# Patient Record
Sex: Female | Born: 1990 | Race: White | Hispanic: No | Marital: Married | Smoking: Never smoker
Health system: Southern US, Community
[De-identification: ages and names within clinical notes are randomized; demographics above are authoritative.]

## PROBLEM LIST (undated history)

## (undated) DIAGNOSIS — K219 Gastro-esophageal reflux disease without esophagitis: Secondary | ICD-10-CM

## (undated) DIAGNOSIS — F32A Depression, unspecified: Secondary | ICD-10-CM

## (undated) DIAGNOSIS — F419 Anxiety disorder, unspecified: Secondary | ICD-10-CM

## (undated) DIAGNOSIS — G43109 Migraine with aura, not intractable, without status migrainosus: Secondary | ICD-10-CM

## (undated) DIAGNOSIS — N83209 Unspecified ovarian cyst, unspecified side: Secondary | ICD-10-CM

## (undated) DIAGNOSIS — S42309A Unspecified fracture of shaft of humerus, unspecified arm, initial encounter for closed fracture: Secondary | ICD-10-CM

## (undated) HISTORY — DX: Depression, unspecified: F32.A

## (undated) HISTORY — PX: LAPAROSCOPIC OVARIAN CYSTECTOMY: SUR786

## (undated) HISTORY — DX: Anxiety disorder, unspecified: F41.9

## (undated) HISTORY — PX: OTHER SURGICAL HISTORY: SHX169

## (undated) HISTORY — PX: COLON SURGERY: SHX602

## (undated) HISTORY — PX: WISDOM TOOTH EXTRACTION: SHX21

## (undated) HISTORY — DX: Gastro-esophageal reflux disease without esophagitis: K21.9

---
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
## 2015-10-10 DIAGNOSIS — S42309A Unspecified fracture of shaft of humerus, unspecified arm, initial encounter for closed fracture: Secondary | ICD-10-CM

## 2015-10-10 HISTORY — DX: Unspecified fracture of shaft of humerus, unspecified arm, initial encounter for closed fracture: S42.309A

## 2016-04-21 DIAGNOSIS — Z01419 Encounter for gynecological examination (general) (routine) without abnormal findings: Secondary | ICD-10-CM | POA: Diagnosis not present

## 2016-04-21 DIAGNOSIS — Z681 Body mass index (BMI) 19 or less, adult: Secondary | ICD-10-CM | POA: Diagnosis not present

## 2016-04-21 DIAGNOSIS — Z113 Encounter for screening for infections with a predominantly sexual mode of transmission: Secondary | ICD-10-CM | POA: Diagnosis not present

## 2016-05-03 DIAGNOSIS — F419 Anxiety disorder, unspecified: Secondary | ICD-10-CM | POA: Diagnosis not present

## 2016-06-09 DIAGNOSIS — F419 Anxiety disorder, unspecified: Secondary | ICD-10-CM | POA: Diagnosis not present

## 2016-10-09 HISTORY — PX: OTHER SURGICAL HISTORY: SHX169

## 2016-11-02 DIAGNOSIS — M25571 Pain in right ankle and joints of right foot: Secondary | ICD-10-CM | POA: Diagnosis not present

## 2016-11-02 DIAGNOSIS — M25572 Pain in left ankle and joints of left foot: Secondary | ICD-10-CM | POA: Diagnosis not present

## 2017-01-31 DIAGNOSIS — S52131A Displaced fracture of neck of right radius, initial encounter for closed fracture: Secondary | ICD-10-CM | POA: Diagnosis not present

## 2017-01-31 DIAGNOSIS — M25521 Pain in right elbow: Secondary | ICD-10-CM | POA: Diagnosis not present

## 2017-01-31 DIAGNOSIS — M25531 Pain in right wrist: Secondary | ICD-10-CM | POA: Diagnosis not present

## 2017-04-04 DIAGNOSIS — R51 Headache: Secondary | ICD-10-CM | POA: Diagnosis not present

## 2017-04-04 DIAGNOSIS — H539 Unspecified visual disturbance: Secondary | ICD-10-CM | POA: Diagnosis not present

## 2017-04-05 DIAGNOSIS — H538 Other visual disturbances: Secondary | ICD-10-CM | POA: Diagnosis not present

## 2017-04-05 DIAGNOSIS — R51 Headache: Secondary | ICD-10-CM | POA: Diagnosis not present

## 2017-04-05 DIAGNOSIS — R110 Nausea: Secondary | ICD-10-CM | POA: Diagnosis not present

## 2017-04-17 DIAGNOSIS — G43109 Migraine with aura, not intractable, without status migrainosus: Secondary | ICD-10-CM | POA: Diagnosis not present

## 2017-04-17 DIAGNOSIS — R0989 Other specified symptoms and signs involving the circulatory and respiratory systems: Secondary | ICD-10-CM | POA: Diagnosis not present

## 2017-04-25 DIAGNOSIS — R0989 Other specified symptoms and signs involving the circulatory and respiratory systems: Secondary | ICD-10-CM | POA: Diagnosis not present

## 2017-08-13 DIAGNOSIS — Z113 Encounter for screening for infections with a predominantly sexual mode of transmission: Secondary | ICD-10-CM | POA: Diagnosis not present

## 2017-08-13 DIAGNOSIS — Z124 Encounter for screening for malignant neoplasm of cervix: Secondary | ICD-10-CM | POA: Diagnosis not present

## 2017-08-13 DIAGNOSIS — Z1151 Encounter for screening for human papillomavirus (HPV): Secondary | ICD-10-CM | POA: Diagnosis not present

## 2017-08-13 DIAGNOSIS — Z30019 Encounter for initial prescription of contraceptives, unspecified: Secondary | ICD-10-CM | POA: Diagnosis not present

## 2017-08-13 DIAGNOSIS — Z01419 Encounter for gynecological examination (general) (routine) without abnormal findings: Secondary | ICD-10-CM | POA: Diagnosis not present

## 2017-09-11 DIAGNOSIS — S93602A Unspecified sprain of left foot, initial encounter: Secondary | ICD-10-CM | POA: Diagnosis not present

## 2017-09-27 DIAGNOSIS — S93602D Unspecified sprain of left foot, subsequent encounter: Secondary | ICD-10-CM | POA: Diagnosis not present

## 2017-09-27 DIAGNOSIS — M7989 Other specified soft tissue disorders: Secondary | ICD-10-CM | POA: Diagnosis not present

## 2017-09-27 DIAGNOSIS — S99922D Unspecified injury of left foot, subsequent encounter: Secondary | ICD-10-CM | POA: Diagnosis not present

## 2017-09-27 DIAGNOSIS — M79672 Pain in left foot: Secondary | ICD-10-CM | POA: Diagnosis not present

## 2017-10-09 DIAGNOSIS — G43109 Migraine with aura, not intractable, without status migrainosus: Secondary | ICD-10-CM

## 2017-10-09 HISTORY — DX: Migraine with aura, not intractable, without status migrainosus: G43.109

## 2017-10-09 HISTORY — PX: OTHER SURGICAL HISTORY: SHX169

## 2018-01-03 DIAGNOSIS — R42 Dizziness and giddiness: Secondary | ICD-10-CM | POA: Diagnosis not present

## 2018-01-03 DIAGNOSIS — R55 Syncope and collapse: Secondary | ICD-10-CM | POA: Diagnosis not present

## 2018-01-04 DIAGNOSIS — Z01812 Encounter for preprocedural laboratory examination: Secondary | ICD-10-CM | POA: Diagnosis not present

## 2018-01-04 DIAGNOSIS — Z30430 Encounter for insertion of intrauterine contraceptive device: Secondary | ICD-10-CM | POA: Diagnosis not present

## 2018-02-01 DIAGNOSIS — Z30431 Encounter for routine checking of intrauterine contraceptive device: Secondary | ICD-10-CM | POA: Diagnosis not present

## 2018-02-01 DIAGNOSIS — Z975 Presence of (intrauterine) contraceptive device: Secondary | ICD-10-CM | POA: Diagnosis not present

## 2018-03-15 DIAGNOSIS — N898 Other specified noninflammatory disorders of vagina: Secondary | ICD-10-CM | POA: Diagnosis not present

## 2018-06-11 DIAGNOSIS — Z113 Encounter for screening for infections with a predominantly sexual mode of transmission: Secondary | ICD-10-CM | POA: Diagnosis not present

## 2018-06-11 DIAGNOSIS — N9089 Other specified noninflammatory disorders of vulva and perineum: Secondary | ICD-10-CM | POA: Diagnosis not present

## 2018-06-11 DIAGNOSIS — N898 Other specified noninflammatory disorders of vagina: Secondary | ICD-10-CM | POA: Diagnosis not present

## 2018-06-11 DIAGNOSIS — Z975 Presence of (intrauterine) contraceptive device: Secondary | ICD-10-CM | POA: Diagnosis not present

## 2018-07-02 DIAGNOSIS — J069 Acute upper respiratory infection, unspecified: Secondary | ICD-10-CM | POA: Diagnosis not present

## 2018-07-04 ENCOUNTER — Emergency Department (HOSPITAL_COMMUNITY)
Admission: EM | Admit: 2018-07-04 | Discharge: 2018-07-04 | Disposition: A | Discharge status: Home / Self Care | Payer: BLUE CROSS/BLUE SHIELD | Attending: Emergency Medicine | Admitting: Emergency Medicine

## 2018-07-04 ENCOUNTER — Emergency Department (HOSPITAL_COMMUNITY): Payer: BLUE CROSS/BLUE SHIELD

## 2018-07-04 ENCOUNTER — Encounter (HOSPITAL_COMMUNITY): Payer: Self-pay

## 2018-07-04 ENCOUNTER — Other Ambulatory Visit: Payer: Self-pay

## 2018-07-04 DIAGNOSIS — R1032 Left lower quadrant pain: Secondary | ICD-10-CM | POA: Diagnosis not present

## 2018-07-04 DIAGNOSIS — N83202 Unspecified ovarian cyst, left side: Secondary | ICD-10-CM | POA: Insufficient documentation

## 2018-07-04 DIAGNOSIS — R110 Nausea: Secondary | ICD-10-CM | POA: Diagnosis not present

## 2018-07-04 DIAGNOSIS — N83292 Other ovarian cyst, left side: Secondary | ICD-10-CM | POA: Diagnosis not present

## 2018-07-04 DIAGNOSIS — D72829 Elevated white blood cell count, unspecified: Secondary | ICD-10-CM | POA: Insufficient documentation

## 2018-07-04 DIAGNOSIS — K567 Ileus, unspecified: Secondary | ICD-10-CM | POA: Diagnosis not present

## 2018-07-04 DIAGNOSIS — R1084 Generalized abdominal pain: Secondary | ICD-10-CM | POA: Diagnosis not present

## 2018-07-04 LAB — CBC
HCT: 40.8 % (ref 36.0–46.0)
HEMOGLOBIN: 13.9 g/dL (ref 12.0–15.0)
MCH: 33.4 pg (ref 26.0–34.0)
MCHC: 34.1 g/dL (ref 30.0–36.0)
MCV: 98.1 fL (ref 78.0–100.0)
Platelets: 216 10*3/uL (ref 150–400)
RBC: 4.16 MIL/uL (ref 3.87–5.11)
RDW: 12.7 % (ref 11.5–15.5)
WBC: 17.1 10*3/uL — ABNORMAL HIGH (ref 4.0–10.5)

## 2018-07-04 LAB — COMPREHENSIVE METABOLIC PANEL
ALBUMIN: 4.1 g/dL (ref 3.5–5.0)
ALK PHOS: 42 U/L (ref 38–126)
ALT: 12 U/L (ref 0–44)
ANION GAP: 8 (ref 5–15)
AST: 14 U/L — ABNORMAL LOW (ref 15–41)
BUN: 12 mg/dL (ref 6–20)
CALCIUM: 9.1 mg/dL (ref 8.9–10.3)
CO2: 25 mmol/L (ref 22–32)
CREATININE: 0.6 mg/dL (ref 0.44–1.00)
Chloride: 106 mmol/L (ref 98–111)
GFR calc Af Amer: >60 mL/min (ref >60)
GFR calc non Af Amer: >60 mL/min (ref >60)
Glucose, Bld: 109 mg/dL — ABNORMAL HIGH (ref 70–99)
Potassium: 3.9 mmol/L (ref 3.5–5.1)
SODIUM: 139 mmol/L (ref 135–145)
Total Bilirubin: 1.5 mg/dL — ABNORMAL HIGH (ref 0.3–1.2)
Total Protein: 7 g/dL (ref 6.5–8.1)

## 2018-07-04 LAB — URINALYSIS, ROUTINE W REFLEX MICROSCOPIC
Bilirubin Urine: NEGATIVE
Glucose, UA: NEGATIVE mg/dL
Ketones, ur: 5 mg/dL — AB
Nitrite: NEGATIVE
Protein, ur: NEGATIVE mg/dL
SPECIFIC GRAVITY, URINE: 1.021 (ref 1.005–1.030)
pH: 6 (ref 5.0–8.0)

## 2018-07-04 LAB — I-STAT BETA HCG BLOOD, ED (MC, WL, AP ONLY)

## 2018-07-04 LAB — LIPASE, BLOOD: Lipase: 31 U/L (ref 11–51)

## 2018-07-04 MED ORDER — SODIUM CHLORIDE 0.9 % IJ SOLN
INTRAMUSCULAR | Status: AC
  Filled 2018-07-04: qty 50

## 2018-07-04 MED ORDER — ONDANSETRON HCL 4 MG PO TABS: 4.0000 mg | ORAL_TABLET | Freq: Four times a day (QID) | ORAL | 0 refills | Status: DC

## 2018-07-04 MED ORDER — SODIUM CHLORIDE 0.9 % IV BOLUS
1000.0000 mL | Freq: Once | INTRAVENOUS | Status: AC
  Administered 2018-07-04: 1000 mL via INTRAVENOUS

## 2018-07-04 MED ORDER — IOPAMIDOL (ISOVUE-300) INJECTION 61%
INTRAVENOUS | Status: AC
  Filled 2018-07-04: qty 100

## 2018-07-04 MED ORDER — IOPAMIDOL (ISOVUE-300) INJECTION 61%
100.0000 mL | Freq: Once | INTRAVENOUS | Status: AC | PRN
  Administered 2018-07-04: 100 mL via INTRAVENOUS

## 2018-07-04 MED ORDER — ONDANSETRON HCL 4 MG/2ML IJ SOLN
4.0000 mg | Freq: Once | INTRAMUSCULAR | Status: AC
  Administered 2018-07-04: 4 mg via INTRAVENOUS
  Filled 2018-07-04: qty 2

## 2018-07-04 MED ORDER — SODIUM CHLORIDE 0.9 % IV SOLN
INTRAVENOUS | Status: DC
  Administered 2018-07-04: 12:00:00 via INTRAVENOUS

## 2018-07-04 MED ORDER — MORPHINE SULFATE (PF) 4 MG/ML IV SOLN
4.0000 mg | Freq: Once | INTRAVENOUS | Status: AC
  Administered 2018-07-04: 4 mg via INTRAVENOUS
  Filled 2018-07-04: qty 1

## 2018-07-04 NOTE — ED Notes (Addendum)
Patient is resting comfortably. Will do vitals when pt wakes up.

## 2018-07-04 NOTE — Discharge Instructions (Signed)
Use Tylenol as needed for pain and fever.  Make sure you are eating a very bland diet at this time such as chicken noodle soup, broth, Sprite, ginger ale, applesauce, toast.  Return if the pain becomes worse, you develop vomiting that is not controlled or any other concern.  Plan on following up with your doctor at [facility] physicians if symptoms do not resolve.

## 2018-07-04 NOTE — ED Provider Notes (Signed)
[facility]-EMERGENCY DEPT Provider Note   CSN: [id number] Arrival date & time: 07/04/18  1030     History   Chief Complaint Chief Complaint  Patient presents with  . sent from PCP  . possible bowel obstruction    HPI Claire Adams is a 27 y.o. female.  Patient is a 27 year old healthy female presenting today with worsening abdominal pain.  Abdominal pain started around 3:00 yesterday afternoon and she thought she may be constipated.  She took a stool softener but states she had a small bowel movement overnight and then just some yellow liquid.  She has had nausea without vomiting.  The abdominal pain is just worsened.  She was seen at [facility] walk-in clinic today and had labs done.  She was told she was not pregnant but labs were pending.  She had an x-ray done there that was concerning for obstruction.  She has had no prior abdominal surgeries.  She denies fever today, urinary complaints or vaginal complaints.  IUD was placed in May and she has had no problems.  Pain in her abdomen is worse with walking, hitting bumps in the car or any type of movements.  The history is provided by the patient.    History reviewed. No pertinent past medical history.  There are no active problems to display for this patient.   History reviewed. No pertinent surgical history.   OB History   None      Home Medications    Prior to Admission medications   Not on File    Family History Family History  Problem Relation Age of Onset  . Healthy Mother   . Healthy Father     Social History Social History   Tobacco Use  . Smoking status: Never Smoker  . Smokeless tobacco: Never Used  Substance Use Topics  . Alcohol use: Yes  . Drug use: Never     Allergies   Patient has no known allergies.   Review of Systems Review of Systems  All other systems reviewed and are negative.    Physical Exam Updated Vital Signs BP 113/66 (BP Location: Left Arm)    Pulse (!) 101   Temp 99 F (37.2 C) (Oral)   Resp 16   Ht 5' 8.5" (1.74 m)   Wt 61.2 kg   LMP 06/09/2018   SpO2 100%   BMI 20.23 kg/m   Physical Exam  Constitutional: She is oriented to person, place, and time. She appears well-developed and well-nourished. No distress.  HENT:  Head: Normocephalic and atraumatic.  Mouth/Throat: Oropharynx is clear and moist.  Eyes: Pupils are equal, round, and reactive to light. Conjunctivae and EOM are normal.  Neck: Normal range of motion. Neck supple.  Cardiovascular: Normal rate, regular rhythm and intact distal pulses.  No murmur heard. Pulmonary/Chest: Effort normal and breath sounds normal. No respiratory distress. She has no wheezes. She has no rales.  Abdominal: Soft. She exhibits no distension. There is tenderness in the periumbilical area and left lower quadrant. There is guarding. There is no rebound and no CVA tenderness.  Musculoskeletal: Normal range of motion. She exhibits no edema or tenderness.  Neurological: She is alert and oriented to person, place, and time.  Skin: Skin is warm and dry. No rash noted. No erythema.  Psychiatric: She has a normal mood and affect. Her behavior is normal.  Nursing note and vitals reviewed.    ED Treatments / Results  Labs (all labs ordered are listed,  but only abnormal results are displayed) Labs Reviewed  COMPREHENSIVE METABOLIC PANEL - Abnormal; Notable for the following components:      Result Value   Glucose, Bld 109 (*)    AST 14 (*)    Total Bilirubin 1.5 (*)    All other components within normal limits  CBC - Abnormal; Notable for the following components:   WBC 17.1 (*)    All other components within normal limits  URINALYSIS, ROUTINE W REFLEX MICROSCOPIC - Abnormal; Notable for the following components:   Color, Urine STRAW (*)    Hgb urine dipstick SMALL (*)    Ketones, ur 5 (*)    Leukocytes, UA SMALL (*)    Bacteria, UA RARE (*)    All other components within normal  limits  LIPASE, BLOOD  I-STAT BETA HCG BLOOD, ED ([facility], [facility], [facility] ONLY)    EKG None  Radiology US Transvaginal Non-ob  Result Date: 07/04/2018 CLINICAL DATA:  Severe lower abdomen pain starting today. EXAM: TRANSABDOMINAL AND TRANSVAGINAL ULTRASOUND OF PELVIS DOPPLER ULTRASOUND OF OVARIES TECHNIQUE: Both transabdominal and transvaginal ultrasound examinations of the pelvis were performed. Transabdominal technique was performed for global imaging of the pelvis including uterus, ovaries, adnexal regions, and pelvic cul-de-sac. It was necessary to proceed with endovaginal exam following the transabdominal exam to visualize the ovaries. Color and duplex Doppler ultrasound was utilized to evaluate blood flow to the ovaries. COMPARISON:  None. FINDINGS: Uterus Measurements: 7.6 x 3.5 x 5.5 cm no fibroids or other mass visualized. Endometrium Thickness: 7.5 mm. IUD is identified. No focal abnormality visualized. Right ovary Measurements: 4.1 x 2.3 x 2.7 cm. Normal appearance/no adnexal mass. Left ovary Measurements: 5.9 x 3.6 x 4.2 cm. 5 x 3 x 3.4 cm mild complicated cyst is identified in the left ovary. Pulsed Doppler evaluation of both ovaries demonstrates normal low-resistance arterial and venous waveforms. Other findings No abnormal free fluid. IMPRESSION: No evidence of ovarian torsion bilaterally. 5 x 3 x 3.4 cm mild complicated cyst in the left ovary Electronically Signed   By: Sherian Rein M.D.   On: 07/04/2018 15:54   US Pelvis Complete  Result Date: 07/04/2018 CLINICAL DATA:  Severe lower abdomen pain starting today. EXAM: TRANSABDOMINAL AND TRANSVAGINAL ULTRASOUND OF PELVIS DOPPLER ULTRASOUND OF OVARIES TECHNIQUE: Both transabdominal and transvaginal ultrasound examinations of the pelvis were performed. Transabdominal technique was performed for global imaging of the pelvis including uterus, ovaries, adnexal regions, and pelvic cul-de-sac. It was necessary to proceed with endovaginal exam following  the transabdominal exam to visualize the ovaries. Color and duplex Doppler ultrasound was utilized to evaluate blood flow to the ovaries. COMPARISON:  None. FINDINGS: Uterus Measurements: 7.6 x 3.5 x 5.5 cm no fibroids or other mass visualized. Endometrium Thickness: 7.5 mm. IUD is identified. No focal abnormality visualized. Right ovary Measurements: 4.1 x 2.3 x 2.7 cm. Normal appearance/no adnexal mass. Left ovary Measurements: 5.9 x 3.6 x 4.2 cm. 5 x 3 x 3.4 cm mild complicated cyst is identified in the left ovary. Pulsed Doppler evaluation of both ovaries demonstrates normal low-resistance arterial and venous waveforms. Other findings No abnormal free fluid. IMPRESSION: No evidence of ovarian torsion bilaterally. 5 x 3 x 3.4 cm mild complicated cyst in the left ovary Electronically Signed   By: Sherian Rein M.D.   On: 07/04/2018 15:54   Ct Abdomen Pelvis W Contrast  Result Date: 07/04/2018 CLINICAL DATA:  Diffuse abdominal pain and nausea. Small amount of yellow liquid stool 7 times last night. Clinical  concern for bowel obstruction. Leukocytosis. EXAM: CT ABDOMEN AND PELVIS WITH CONTRAST TECHNIQUE: Multidetector CT imaging of the abdomen and pelvis was performed using the standard protocol following bolus administration of intravenous contrast. CONTRAST:  ISOVUE-300 IOPAMIDOL (ISOVUE-300) INJECTION 61% COMPARISON:  None. FINDINGS: Lower chest: Small amount of subpleural scarring or atelectasis at the left lung base. Hepatobiliary: No focal liver abnormality is seen. No gallstones, gallbladder wall thickening, or biliary dilatation. Pancreas: Unremarkable. No pancreatic ductal dilatation or surrounding inflammatory changes. Spleen: Normal in size without focal abnormality. Adrenals/Urinary Tract: Adrenal glands are unremarkable. Kidneys are normal, without renal calculi, focal lesion, or hydronephrosis. Bladder is unremarkable. Stomach/Bowel: Mildly prominent loops of small bowel in the pelvis and  left lower abdomen without abnormal dilatation. The proximal and distal small bowel loops have normal appearances. The right and transverse colon are mildly prominent without abnormal dilatation. Normal appearing stomach. No evidence of appendicitis. Vascular/Lymphatic: No significant vascular findings are present. No enlarged abdominal or pelvic lymph nodes. Reproductive: Intrauterine device in the uterus. Right ovarian corpus luteum. Left adnexal cystic area measuring 4.6 x 3.2 cm on image number 66 series 2. Other: Small amount of free peritoneal fluid in the pelvic cul-de-sac, within normal limits of physiological fluid. Musculoskeletal: Transitional lumbosacral vertebra with a pseudoarticulation with the sacrum on the left. Mild dextroconvex lumbar scoliosis. IMPRESSION: 1. Mild ileus involving portions of the small bowel and colon with no abnormal dilatation suspicious for obstruction at this time. 2. Left adnexal cystic area measuring 4.6 x 3.2 cm. This is most likely a simple left ovarian cyst. This does not have typical features of a tubo-ovarian abscess at this time. This is almost certainly benign, and no specific imaging follow up is recommended according to the Society of Radiologists in Ultrasound 2010 Consensus Conference Statement (D Lenis Noon et al. Management of Asymptomatic Ovarian and Other Adnexal Cysts Imaged at [place]: Society of Radiologists in Ultrasound Consensus Conference Statement 2010. Radiology 256 (Sept 2010): 943-954.). Electronically Signed   By: Beckie Salts M.D.   On: 07/04/2018 13:03   [place] Art/ven Flow Abd Pelv Doppler  Result Date: 07/04/2018 CLINICAL DATA:  Severe lower abdomen pain starting today. EXAM: TRANSABDOMINAL AND TRANSVAGINAL ULTRASOUND OF PELVIS DOPPLER ULTRASOUND OF OVARIES TECHNIQUE: Both transabdominal and transvaginal ultrasound examinations of the pelvis were performed. Transabdominal technique was performed for global imaging of the pelvis including uterus,  ovaries, adnexal regions, and pelvic cul-de-sac. It was necessary to proceed with endovaginal exam following the transabdominal exam to visualize the ovaries. Color and duplex Doppler ultrasound was utilized to evaluate blood flow to the ovaries. COMPARISON:  None. FINDINGS: Uterus Measurements: 7.6 x 3.5 x 5.5 cm no fibroids or other mass visualized. Endometrium Thickness: 7.5 mm. IUD is identified. No focal abnormality visualized. Right ovary Measurements: 4.1 x 2.3 x 2.7 cm. Normal appearance/no adnexal mass. Left ovary Measurements: 5.9 x 3.6 x 4.2 cm. 5 x 3 x 3.4 cm mild complicated cyst is identified in the left ovary. Pulsed Doppler evaluation of both ovaries demonstrates normal low-resistance arterial and venous waveforms. Other findings No abnormal free fluid. IMPRESSION: No evidence of ovarian torsion bilaterally. 5 x 3 x 3.4 cm mild complicated cyst in the left ovary Electronically Signed   By: Sherian Rein M.D.   On: 07/04/2018 15:54    Procedures Procedures (including critical care time)  Medications Ordered in ED Medications  sodium chloride 0.9 % bolus 1,000 mL (1,000 mLs Intravenous New Bag/Given 07/04/18 1140)  0.9 %  sodium chloride infusion (  Intravenous New Bag/Given 07/04/18 1141)  morphine 4 MG/ML injection 4 mg (4 mg Intravenous Given 07/04/18 1135)  ondansetron (ZOFRAN) injection 4 mg (4 mg Intravenous Given 07/04/18 1135)     Initial Impression / Assessment and Plan / ED Course  I have reviewed the triage vital signs and the nursing notes.  Pertinent labs & imaging results that were available during my care of the patient were reviewed by me and considered in my medical decision making (see chart for details).     Young healthy female presenting today with abdominal pain and nausea.  She states the pain is worsened since yesterday.  She denies any urinary or vaginal complaints.  Patient thought she was constipated however she normally has bowel movements every 2 to 3 days  and her last bowel movement other than a small 1 this morning was 2 days ago.  On exam patient has some guarding and significant tenderness in the left lower quadrant.  Concern for possible diverticulitis, perforation, atypical appendicitis, lower suspicion for obstruction, ovarian pathology or possibly kidney stone as well. White count is significant for leukocytosis of 17,000.  CMP, UA, lipase and hCG still pending.  CT of the abdomen and pelvis pending.  Patient given IV fluids and pain control.  2:04 PM Rest of labs and urine within normal limits.  CT is suggestive of mild ileus intermittently involving the small intestine and colon.  Also patient has a 3 x 4 cm left ovarian cyst that does not appear complicated.  On reevaluation patient is improved after morphine.  We will do an ultrasound to ensure no ovarian torsion.  This is most likely viral in etiology.  Findings discussed with the patient and her parents.  4:12 PM [place] neg for torsion and pt will follow up with OB/GYN for repeat US.  Pt still having improved pain.  D/ced home with zofran and to take tylenol  Final Clinical Impressions(s) / ED Diagnoses   Final diagnoses:  Generalized abdominal pain  Left ovarian cyst    ED Discharge Orders         Ordered    ondansetron (ZOFRAN) 4 MG tablet  Every 6 hours     07/04/18 1603           Gwyneth Sprout, MD 07/04/18 [phone]

## 2018-07-04 NOTE — ED Triage Notes (Signed)
Patient was seen by her PCP today and sent to the ED for possible bowel obstruction. Patient reports nausea and had a small amount of stool and had yellow liquid stool x 7 last night. Patient c/o generalized abdominal pain.

## 2018-07-04 NOTE — ED Notes (Signed)
Family at bedside. 

## 2018-07-30 DIAGNOSIS — R933 Abnormal findings on diagnostic imaging of other parts of digestive tract: Secondary | ICD-10-CM | POA: Diagnosis not present

## 2018-07-30 DIAGNOSIS — K5900 Constipation, unspecified: Secondary | ICD-10-CM | POA: Diagnosis not present

## 2018-07-30 DIAGNOSIS — R109 Unspecified abdominal pain: Secondary | ICD-10-CM | POA: Diagnosis not present

## 2018-07-30 DIAGNOSIS — R140 Abdominal distension (gaseous): Secondary | ICD-10-CM | POA: Diagnosis not present

## 2018-07-31 DIAGNOSIS — R102 Pelvic and perineal pain: Secondary | ICD-10-CM | POA: Diagnosis not present

## 2018-07-31 DIAGNOSIS — N83202 Unspecified ovarian cyst, left side: Secondary | ICD-10-CM | POA: Diagnosis not present

## 2018-07-31 DIAGNOSIS — Z30013 Encounter for initial prescription of injectable contraceptive: Secondary | ICD-10-CM | POA: Diagnosis not present

## 2018-07-31 DIAGNOSIS — N939 Abnormal uterine and vaginal bleeding, unspecified: Secondary | ICD-10-CM | POA: Diagnosis not present

## 2018-07-31 DIAGNOSIS — N921 Excessive and frequent menstruation with irregular cycle: Secondary | ICD-10-CM | POA: Diagnosis not present

## 2018-08-05 DIAGNOSIS — N83201 Unspecified ovarian cyst, right side: Secondary | ICD-10-CM | POA: Diagnosis not present

## 2018-08-05 DIAGNOSIS — N83202 Unspecified ovarian cyst, left side: Secondary | ICD-10-CM | POA: Diagnosis not present

## 2018-08-05 DIAGNOSIS — R102 Pelvic and perineal pain: Secondary | ICD-10-CM | POA: Diagnosis not present

## 2018-08-09 DIAGNOSIS — Z30432 Encounter for removal of intrauterine contraceptive device: Secondary | ICD-10-CM | POA: Diagnosis not present

## 2018-08-09 DIAGNOSIS — N838 Other noninflammatory disorders of ovary, fallopian tube and broad ligament: Secondary | ICD-10-CM | POA: Diagnosis not present

## 2018-08-09 DIAGNOSIS — N83202 Unspecified ovarian cyst, left side: Secondary | ICD-10-CM | POA: Diagnosis not present

## 2018-08-09 DIAGNOSIS — N83291 Other ovarian cyst, right side: Secondary | ICD-10-CM | POA: Diagnosis not present

## 2018-08-09 DIAGNOSIS — N736 Female pelvic peritoneal adhesions (postinfective): Secondary | ICD-10-CM | POA: Diagnosis not present

## 2018-08-09 DIAGNOSIS — Z975 Presence of (intrauterine) contraceptive device: Secondary | ICD-10-CM | POA: Diagnosis not present

## 2018-08-09 DIAGNOSIS — N8311 Corpus luteum cyst of right ovary: Secondary | ICD-10-CM | POA: Diagnosis not present

## 2018-08-09 DIAGNOSIS — N83201 Unspecified ovarian cyst, right side: Secondary | ICD-10-CM | POA: Diagnosis not present

## 2018-08-10 ENCOUNTER — Encounter (HOSPITAL_COMMUNITY): Payer: Self-pay | Admitting: *Deleted

## 2018-08-10 ENCOUNTER — Inpatient Hospital Stay (HOSPITAL_COMMUNITY): Payer: BLUE CROSS/BLUE SHIELD

## 2018-08-10 ENCOUNTER — Inpatient Hospital Stay (HOSPITAL_COMMUNITY)
Admission: AD | Admit: 2018-08-10 | Discharge: 2018-08-13 | DRG: 863 | Disposition: A | Discharge status: Home / Self Care | Payer: BLUE CROSS/BLUE SHIELD | Attending: Obstetrics and Gynecology | Admitting: Obstetrics and Gynecology

## 2018-08-10 DIAGNOSIS — T8141XA Infection following a procedure, superficial incisional surgical site, initial encounter: Secondary | ICD-10-CM | POA: Diagnosis not present

## 2018-08-10 DIAGNOSIS — T8149XA Infection following a procedure, other surgical site, initial encounter: Secondary | ICD-10-CM | POA: Diagnosis present

## 2018-08-10 DIAGNOSIS — R109 Unspecified abdominal pain: Secondary | ICD-10-CM | POA: Diagnosis not present

## 2018-08-10 DIAGNOSIS — L0291 Cutaneous abscess, unspecified: Secondary | ICD-10-CM | POA: Diagnosis not present

## 2018-08-10 HISTORY — DX: Unspecified ovarian cyst, unspecified side: N83.209

## 2018-08-10 HISTORY — DX: Unspecified fracture of shaft of humerus, unspecified arm, initial encounter for closed fracture: S42.309A

## 2018-08-10 HISTORY — DX: Migraine with aura, not intractable, without status migrainosus: G43.109

## 2018-08-10 LAB — CBC WITH DIFFERENTIAL/PLATELET
BASOS ABS: 0 10*3/uL (ref 0.0–0.1)
Basophils Relative: 0 %
EOS PCT: 0 %
Eosinophils Absolute: 0 10*3/uL (ref 0.0–0.5)
HCT: 33.6 % — ABNORMAL LOW (ref 36.0–46.0)
HEMOGLOBIN: 11 g/dL — AB (ref 12.0–15.0)
LYMPHS ABS: 3.2 10*3/uL (ref 0.7–4.0)
LYMPHS PCT: 20 %
MCH: 31.5 pg (ref 26.0–34.0)
MCHC: 32.7 g/dL (ref 30.0–36.0)
MCV: 96.3 fL (ref 80.0–100.0)
Monocytes Absolute: 0.6 10*3/uL (ref 0.1–1.0)
Monocytes Relative: 3 %
NEUTROS ABS: 12.7 10*3/uL — AB (ref 1.7–7.7)
NEUTROS PCT: 77 %
Platelets: 508 10*3/uL — ABNORMAL HIGH (ref 150–400)
RBC: 3.49 MIL/uL — AB (ref 3.87–5.11)
RDW: 13.8 % (ref 11.5–15.5)
WBC: 16.5 10*3/uL — AB (ref 4.0–10.5)
nRBC: 0 % (ref 0.0–0.2)

## 2018-08-10 LAB — COMPREHENSIVE METABOLIC PANEL
ALK PHOS: 52 U/L (ref 38–126)
ALT: 11 U/L (ref 0–44)
AST: 11 U/L — AB (ref 15–41)
Albumin: 3.1 g/dL — ABNORMAL LOW (ref 3.5–5.0)
Anion gap: 9 (ref 5–15)
BUN: 6 mg/dL (ref 6–20)
CALCIUM: 8.6 mg/dL — AB (ref 8.9–10.3)
CHLORIDE: 101 mmol/L (ref 98–111)
CO2: 28 mmol/L (ref 22–32)
CREATININE: 0.64 mg/dL (ref 0.44–1.00)
GFR calc Af Amer: >60 mL/min (ref >60)
Glucose, Bld: 106 mg/dL — ABNORMAL HIGH (ref 70–99)
Potassium: 4.1 mmol/L (ref 3.5–5.1)
SODIUM: 138 mmol/L (ref 135–145)
Total Bilirubin: 0.3 mg/dL (ref 0.3–1.2)
Total Protein: 7.3 g/dL (ref 6.5–8.1)

## 2018-08-10 LAB — URINALYSIS, ROUTINE W REFLEX MICROSCOPIC
Bacteria, UA: NONE SEEN
Bilirubin Urine: NEGATIVE
GLUCOSE, UA: NEGATIVE mg/dL
Ketones, ur: NEGATIVE mg/dL
Nitrite: NEGATIVE
PH: 7 (ref 5.0–8.0)
Protein, ur: NEGATIVE mg/dL
SPECIFIC GRAVITY, URINE: 1.011 (ref 1.005–1.030)

## 2018-08-10 LAB — POCT PREGNANCY, URINE: Preg Test, Ur: NEGATIVE

## 2018-08-10 MED ORDER — SODIUM CHLORIDE 0.9% FLUSH: 9.0000 mL | INTRAVENOUS | Status: DC | PRN

## 2018-08-10 MED ORDER — KETOROLAC TROMETHAMINE 60 MG/2ML IM SOLN
60.0000 mg | Freq: Once | INTRAMUSCULAR | Status: AC
  Administered 2018-08-10: 60 mg via INTRAMUSCULAR
  Filled 2018-08-10: qty 2

## 2018-08-10 MED ORDER — HYDROMORPHONE HCL 1 MG/ML IJ SOLN
1.0000 mg | Freq: Once | INTRAMUSCULAR | Status: DC
  Filled 2018-08-10: qty 1

## 2018-08-10 MED ORDER — PIPERACILLIN-TAZOBACTAM 3.375 G IVPB 30 MIN
3.3750 g | Freq: Once | INTRAVENOUS | Status: AC
  Administered 2018-08-10: 3.375 g via INTRAVENOUS
  Filled 2018-08-10: qty 50

## 2018-08-10 MED ORDER — HYDROMORPHONE HCL 1 MG/ML IJ SOLN
1.0000 mg | Freq: Once | INTRAMUSCULAR | Status: AC
  Administered 2018-08-10: 1 mg via INTRAVENOUS

## 2018-08-10 MED ORDER — LACTATED RINGERS IV SOLN
INTRAVENOUS | Status: DC
  Administered 2018-08-10 – 2018-08-11 (×2): via INTRAVENOUS

## 2018-08-10 MED ORDER — NALOXONE HCL 0.4 MG/ML IJ SOLN: 0.4000 mg | INTRAMUSCULAR | Status: DC | PRN

## 2018-08-10 MED ORDER — PIPERACILLIN-TAZOBACTAM 3.375 G IVPB
3.3750 g | Freq: Three times a day (TID) | INTRAVENOUS | Status: DC
  Administered 2018-08-11 – 2018-08-13 (×7): 3.375 g via INTRAVENOUS
  Filled 2018-08-10 (×10): qty 50

## 2018-08-10 MED ORDER — ONDANSETRON HCL 4 MG/2ML IJ SOLN: 4.0000 mg | Freq: Four times a day (QID) | INTRAMUSCULAR | Status: DC | PRN

## 2018-08-10 MED ORDER — DIPHENHYDRAMINE HCL 50 MG/ML IJ SOLN: 12.5000 mg | Freq: Four times a day (QID) | INTRAMUSCULAR | Status: DC | PRN

## 2018-08-10 MED ORDER — PRENATAL MULTIVITAMIN CH
1.0000 | ORAL_TABLET | Freq: Every day | ORAL | Status: DC
  Administered 2018-08-12: 1 via ORAL
  Filled 2018-08-10 (×2): qty 1

## 2018-08-10 MED ORDER — IOPAMIDOL (ISOVUE-300) INJECTION 61%
75.0000 mL | Freq: Once | INTRAVENOUS | Status: AC | PRN
  Administered 2018-08-10: 75 mL via INTRAVENOUS

## 2018-08-10 MED ORDER — DIPHENHYDRAMINE HCL 12.5 MG/5ML PO ELIX
12.5000 mg | ORAL_SOLUTION | Freq: Four times a day (QID) | ORAL | Status: DC | PRN
  Filled 2018-08-10: qty 5

## 2018-08-10 MED ORDER — MORPHINE SULFATE 2 MG/ML IV SOLN
INTRAVENOUS | Status: DC
  Administered 2018-08-10: 23:00:00 via INTRAVENOUS
  Administered 2018-08-11: 2 mg via INTRAVENOUS
  Administered 2018-08-11: 0 mg via INTRAVENOUS
  Filled 2018-08-10: qty 30

## 2018-08-10 NOTE — MAU Note (Signed)
Pt had a bilateral ovarian cystectomy yesterday.(done at surgical center by Dr. Rana Snare) Reports abd pain has gotten worse since yesterday. Taking pain medications without relief (vicodin)

## 2018-08-10 NOTE — MAU Provider Note (Signed)
History     CSN: [id number]  Arrival date and time: 08/10/18 1451   First Provider Initiated Contact with Patient 08/10/18 1605     Chief Complaint  Patient presents with  . Abdominal Pain   HPI Claire Adams is a 27 y.o. G0P0000 postop from a ovarian cyst removal yesterday who presents with severe abdominal pain. She states the pain started this morning and has gradually gotten worse. She rates the pain an 8/10 and has tried vicodin with no relief. She reports the pain as generalized all over her abdomen and causing nausea.   OB History    Gravida  0   Para  0   Term  0   Preterm  0   AB  0   Living  0     SAB  0   TAB  0   Ectopic  0   Multiple  0   Live Births  0           Past Medical History:  Diagnosis Date  . Ovarian cyst     Past Surgical History:  Procedure Laterality Date  . LAPAROSCOPIC OVARIAN CYSTECTOMY Bilateral   . WISDOM TOOTH EXTRACTION      Family History  Problem Relation Age of Onset  . Healthy Mother   . Healthy Father     Social History   Tobacco Use  . Smoking status: Never Smoker  . Smokeless tobacco: Never Used  Substance Use Topics  . Alcohol use: Yes  . Drug use: Never    Allergies: No Known Allergies  Medications Prior to Admission  Medication Sig Dispense Refill Last Dose  . brompheniramine-pseudoephedrine-DM 30-2-10 MG/5ML syrup Take 5 mLs by mouth 4 (four) times daily as needed (for cough).   0 07/03/2018 at Unknown time  . fluticasone (FLONASE) 50 MCG/ACT nasal spray Place 2 sprays into both nostrils daily.  1 07/03/2018 at Unknown time  . Levonorgestrel (KYLEENA) 19.5 MG IUD 19.5 mg by Intrauterine route once.   07/04/2018 at Unknown time  . ondansetron (ZOFRAN) 4 MG tablet Take 1 tablet (4 mg total) by mouth every 6 (six) hours. 12 tablet 0     Review of Systems  Constitutional: Negative.  Negative for fatigue and fever.  HENT: Negative.   Respiratory: Negative.  Negative for shortness of breath.    Cardiovascular: Negative.  Negative for chest pain.  Gastrointestinal: Positive for abdominal pain and nausea. Negative for constipation, diarrhea and vomiting.  Genitourinary: Positive for vaginal bleeding. Negative for dysuria.  Neurological: Negative.  Negative for dizziness and headaches.   Physical Exam   Blood pressure 107/71, pulse 75, temperature 98.5 F (36.9 C), temperature source Oral, resp. rate 18, height 5' 8.5" (1.74 m), weight 55.8 kg, last menstrual period 07/22/2018.  Physical Exam  Nursing note and vitals reviewed. Constitutional: She is oriented to person, place, and time. She appears well-developed and well-nourished. No distress.  HENT:  Head: Normocephalic.  Eyes: Pupils are equal, round, and reactive to light.  Cardiovascular: Normal rate, regular rhythm and normal heart sounds.  Respiratory: Effort normal and breath sounds normal. No respiratory distress.  GI: Soft. Bowel sounds are normal. She exhibits no distension. There is tenderness in the right lower quadrant, epigastric area and left lower quadrant. There is guarding. There is no rebound.  Neurological: She is alert and oriented to person, place, and time.  Skin: Skin is warm and dry.  Psychiatric: She has a normal mood and affect. Her behavior is  normal. Judgment and thought content normal.    MAU Course  Procedures Results for orders placed or performed during the hospital encounter of 08/10/18 (from the past 24 hour(s))  Urinalysis, Routine w reflex microscopic     Status: Abnormal   Collection Time: 08/10/18  3:35 PM  Result Value Ref Range   Color, Urine YELLOW YELLOW   APPearance CLEAR CLEAR   Specific Gravity, Urine 1.011 1.005 - 1.030   pH 7.0 5.0 - 8.0   Glucose, UA NEGATIVE NEGATIVE mg/dL   Hgb urine dipstick MODERATE (A) NEGATIVE   Bilirubin Urine NEGATIVE NEGATIVE   Ketones, ur NEGATIVE NEGATIVE mg/dL   Protein, ur NEGATIVE NEGATIVE mg/dL   Nitrite NEGATIVE NEGATIVE   Leukocytes,  UA TRACE (A) NEGATIVE   RBC / HPF 21-50 0 - 5 RBC/hpf   WBC, UA 6-10 0 - 5 WBC/hpf   Bacteria, UA NONE SEEN NONE SEEN   Squamous Epithelial / LPF 0-5 0 - 5   Mucus PRESENT   Pregnancy, urine POC     Status: None   Collection Time: 08/10/18  4:01 PM  Result Value Ref Range   Preg Test, Ur NEGATIVE NEGATIVE  CBC with Differential/Platelet     Status: Abnormal   Collection Time: 08/10/18  4:47 PM  Result Value Ref Range   WBC 16.5 (H) 4.0 - 10.5 K/uL   RBC 3.49 (L) 3.87 - 5.11 MIL/uL   Hemoglobin 11.0 (L) 12.0 - 15.0 g/dL   HCT 09.8 (L) 11.9 - 14.7 %   MCV 96.3 80.0 - 100.0 fL   MCH 31.5 26.0 - 34.0 pg   MCHC 32.7 30.0 - 36.0 g/dL   RDW 82.9 56.2 - 13.0 %   Platelets 508 (H) 150 - 400 K/uL   nRBC 0.0 0.0 - 0.2 %   Neutrophils Relative % 77 %   Neutro Abs 12.7 (H) 1.7 - 7.7 K/uL   Lymphocytes Relative 20 %   Lymphs Abs 3.2 0.7 - 4.0 K/uL   Monocytes Relative 3 %   Monocytes Absolute 0.6 0.1 - 1.0 K/uL   Eosinophils Relative 0 %   Eosinophils Absolute 0.0 0.0 - 0.5 K/uL   Basophils Relative 0 %   Basophils Absolute 0.0 0.0 - 0.1 K/uL  Comprehensive metabolic panel     Status: Abnormal   Collection Time: 08/10/18  4:47 PM  Result Value Ref Range   Sodium 138 135 - 145 mmol/L   Potassium 4.1 3.5 - 5.1 mmol/L   Chloride 101 98 - 111 mmol/L   CO2 28 22 - 32 mmol/L   Glucose, Bld 106 (H) 70 - 99 mg/dL   BUN 6 6 - 20 mg/dL   Creatinine, Ser 8.65 0.44 - 1.00 mg/dL   Calcium 8.6 (L) 8.9 - 10.3 mg/dL   Total Protein 7.3 6.5 - 8.1 g/dL   Albumin 3.1 (L) 3.5 - 5.0 g/dL   AST 11 (L) 15 - 41 U/L   ALT 11 0 - 44 U/L   Alkaline Phosphatase 52 38 - 126 U/L   Total Bilirubin 0.3 0.3 - 1.2 mg/dL   GFR calc non Af Amer >60 >60 mL/min   GFR calc Af Amer >60 >60 mL/min   Anion gap 9 5 - 15   Ct Abdomen Pelvis W Contrast  Result Date: 08/10/2018 CLINICAL DATA:  27 year old female status post recent bilateral ovarian cystectomy presenting with worsening abdominal pain. EXAM: CT ABDOMEN  AND PELVIS WITH CONTRAST TECHNIQUE: Multidetector CT imaging of the abdomen  and pelvis was performed using the standard protocol following bolus administration of intravenous contrast. CONTRAST:  75mL ISOVUE-300 IOPAMIDOL (ISOVUE-300) INJECTION 61% COMPARISON:  CT of the abdomen pelvis dated 07/04/2018 FINDINGS: Lower chest: Minimal bibasilar dependent atelectatic changes. The visualized lung bases are otherwise clear. There is small scattered intraperitoneal air consistent with provided history of recent surgery. No significant free fluid. Hepatobiliary: No focal liver abnormality is seen. No gallstones, gallbladder wall thickening, or biliary dilatation. Pancreas: Unremarkable. No pancreatic ductal dilatation or surrounding inflammatory changes. Spleen: Normal in size without focal abnormality. Adrenals/Urinary Tract: Adrenal glands are unremarkable. Kidneys are normal, without renal calculi, focal lesion, or hydronephrosis. Bladder is unremarkable. Stomach/Bowel: Mild thickened appearance of the rectosigmoid, likely reactive to inflammatory changes of the pelvis. There is no bowel obstruction. There is moderate stool throughout the colon. The appendix is normal. Vascular/Lymphatic: The abdominal aorta and IVC are unremarkable. The no portal venous gas. There is no adenopathy. Reproductive: The uterus is anteverted and grossly unremarkable as visualized. There is a loculated appearing fluid collection measuring 3.3 x 3.6 cm in the left hemipelvis containing a small pocket of air. A rind of soft tissue surrounding this fluid collection likely represents the left ovarian tissue. This fluid collection appears to be in the same area as the previously seen left ovarian cyst and likely corresponds to the cyst seen previously. There is a focal area of discontinuity in the wall of this fluid collection with extension of the fluid into the right posterior pelvis. This fluid appears to communicate with a 2.5 x 4.0 cm fluid  in the right hemipelvis which also appears loculated and surrounds the right ovary. Overall the amount of pelvic fluid have increased since the prior CT of 07/04/2018. Loculated appearance with enhancement of the capsule suggest a superimposed infectious process or developing abscess. Clinical correlation is recommended. Other: None Musculoskeletal: There are pockets of soft tissue air in the left groin and medial upper left thigh. Stranding of the periumbilical subcutaneous soft tissues likely related to laparoscopic port placement. The osseous structures are intact. IMPRESSION: 1. Loculated fluids within the pelvis as described may represent rupture of the left ovarian cyst with overall hyperemia of the surrounding tissue/capsule and findings concerning for superimposed infection or developing abscess. 2. Small pockets of intraperitoneal air, postoperative. Electronically Signed   By: Elgie Collard M.D.   On: 08/10/2018 21:40   MDM UA, UPT CBC with Diff CMP Toradol IM- patient still rating pain 7/10 CT Abdomen pelvis  Consulted with Dr. Marcelle Overlie regarding CT scan results and presentation- will admit to [facility] for IV antibiotics and pain management  Assessment and Plan   1. Post-operative wound abscess    -Admit to [facility] -Start IV Zosyn per protocol -Morphine PCA -Patient may eat -MD to assume care of patient   Rolm Bookbinder CNM 08/10/2018, 4:05 PM

## 2018-08-11 ENCOUNTER — Encounter (HOSPITAL_COMMUNITY): Payer: Self-pay

## 2018-08-11 ENCOUNTER — Other Ambulatory Visit: Payer: Self-pay

## 2018-08-11 MED ORDER — IBUPROFEN 600 MG PO TABS
600.0000 mg | ORAL_TABLET | Freq: Four times a day (QID) | ORAL | Status: DC | PRN
  Administered 2018-08-11 – 2018-08-13 (×4): 600 mg via ORAL
  Filled 2018-08-11 (×4): qty 1

## 2018-08-11 MED ORDER — OXYCODONE-ACETAMINOPHEN 5-325 MG PO TABS
1.0000 | ORAL_TABLET | ORAL | Status: DC | PRN
  Administered 2018-08-11 – 2018-08-13 (×8): 1 via ORAL
  Filled 2018-08-11 (×8): qty 1

## 2018-08-11 NOTE — Progress Notes (Signed)
Pharmacy Antibiotic Note  Claire Adams is a 27 y.o. female s/p bilateral ovarian cystoscopy admitted on 08/10/2018 with complaints of diffuse abdominal pain. WBCs are elevated. Pharmacy has been consulted for Zosyn dosing.  Plan: Zosyn 3.375gm IV every 8 hours  Height: 5' 8.5" (174 cm) Weight: 123 lb (55.8 kg) IBW/kg (Calculated) : 65.05  Temp (24hrs), Avg:98.8 F (37.1 C), Min:98.3 F (36.8 C), Max:99.7 F (37.6 C)  Recent Labs  Lab 08/10/18 1647  WBC 16.5*  CREATININE 0.64    Estimated Creatinine Clearance: 94.7 mL/min (by C-G formula based on SCr of 0.64 mg/dL).    No Known Allergies  Antimicrobials this admission:  Dose adjustments this admission: N/A  Microbiology results:  Thank you for allowing pharmacy to be a part of this patient's care.  Arelia Sneddon 08/11/2018 2:32 AM

## 2018-08-11 NOTE — Progress Notes (Signed)
20ml of Dilaudid PCA wasted. Lourdes Sledge, RN witness. Carmelina Dane, RN

## 2018-08-11 NOTE — Progress Notes (Signed)
Patient ID: Claire Adams, female   DOB: 07-14-1991, 27 y.o.   MRN: [id number]   POD 2  S// feeling some better, used MS PCA X 4 doses during the night  O//BP (!) 80/49 (BP Location: Left Arm)   Pulse 71   Temp 98.5 F (36.9 C) (Oral)   Resp 17   Ht 5' 8.5" (1.74 m)   Wt 55.8 kg   LMP 07/22/2018   SpO2 92%   BMI 18.43 kg/m   Abd incs C/D, soft + BS  CBC    Component Value Date/Time   WBC 16.5 (H) 08/10/2018 1647   RBC 3.49 (L) 08/10/2018 1647   HGB 11.0 (L) 08/10/2018 1647   HCT 33.6 (L) 08/10/2018 1647   PLT 508 (H) 08/10/2018 1647   MCV 96.3 08/10/2018 1647   MCH 31.5 08/10/2018 1647   MCHC 32.7 08/10/2018 1647   RDW 13.8 08/10/2018 1647   LYMPHSABS 3.2 08/10/2018 1647   MONOABS 0.6 08/10/2018 1647   EOSABS 0.0 08/10/2018 1647   BASOSABS 0.0 08/10/2018 1647   A+P// post op pelvic infxn, pt was sent home on ABX post op, told she had some pus drained from 1 ovarian cyst  Will D/C MS, cont Zosyn, poss D/C in AM

## 2018-08-12 LAB — CBC
HCT: 29.3 % — ABNORMAL LOW (ref 36.0–46.0)
HEMOGLOBIN: 9.6 g/dL — AB (ref 12.0–15.0)
MCH: 31.1 pg (ref 26.0–34.0)
MCHC: 32.8 g/dL (ref 30.0–36.0)
MCV: 94.8 fL (ref 80.0–100.0)
PLATELETS: 502 10*3/uL — AB (ref 150–400)
RBC: 3.09 MIL/uL — AB (ref 3.87–5.11)
RDW: 14 % (ref 11.5–15.5)
WBC: 9.5 10*3/uL (ref 4.0–10.5)

## 2018-08-12 MED ORDER — ENSURE ENLIVE PO LIQD
237.0000 mL | Freq: Two times a day (BID) | ORAL | Status: DC
  Administered 2018-08-12 – 2018-08-13 (×2): 237 mL via ORAL
  Filled 2018-08-12 (×3): qty 237

## 2018-08-12 MED ORDER — BISACODYL 10 MG RE SUPP
10.0000 mg | Freq: Every day | RECTAL | Status: DC | PRN
  Administered 2018-08-12: 10 mg via RECTAL
  Filled 2018-08-12: qty 1

## 2018-08-12 MED ORDER — ONDANSETRON 8 MG PO TBDP
8.0000 mg | ORAL_TABLET | Freq: Three times a day (TID) | ORAL | Status: DC | PRN
  Administered 2018-08-12 – 2018-08-13 (×2): 8 mg via ORAL
  Filled 2018-08-12 (×3): qty 1

## 2018-08-12 MED ORDER — ONDANSETRON HCL 4 MG/2ML IJ SOLN
4.0000 mg | Freq: Four times a day (QID) | INTRAMUSCULAR | Status: DC
  Administered 2018-08-12: 4 mg via INTRAVENOUS
  Filled 2018-08-12: qty 2

## 2018-08-12 MED ORDER — POLYETHYLENE GLYCOL 3350 17 G PO PACK
17.0000 g | PACK | Freq: Every day | ORAL | Status: DC
  Administered 2018-08-12 – 2018-08-13 (×2): 17 g via ORAL
  Filled 2018-08-12 (×2): qty 1

## 2018-08-12 MED ORDER — ONDANSETRON HCL 4 MG/2ML IJ SOLN
4.0000 mg | Freq: Four times a day (QID) | INTRAMUSCULAR | Status: DC | PRN
  Administered 2018-08-12: 4 mg via INTRAVENOUS
  Filled 2018-08-12: qty 2

## 2018-08-12 NOTE — Progress Notes (Signed)
Subjective: Patient reports tolerating PO, + flatus and no problems voiding.   Still with nausea No BM for 3 days Was able to eat some yesterday for the first time in a while Objective: I have reviewed patient's vital signs, intake and output, medications and labs.  General: alert, cooperative, appears stated age and mild distress GI: incision: clean, dry and intact  Mildly distended generally.  BS+  No rebound, guarding   Assessment/Plan: TOA - WBC now normal after 24 hours IV Zosyn Continue abx Will encourage ambulation, ducolax prn Repeat CBC in am Postop - doint well from LOA, Right ov cystecomy, and diagnosis of Left TOA  LOS: 2 days    Turner Daniels 08/12/2018, 9:18 AM

## 2018-08-12 NOTE — Progress Notes (Signed)
Admission nutrition screen triggered for unintentional weight loss > 10 lbs within the last month. . Patients chart reviewed and assessed  for nutritional risk. Pt reports a 15 lb weight loss over the past month due to pain. Appetite is now returning. Ate spicy food at lunch and experienced nausea after.  Recommendations to Pt: Ensure BID Order bland low fat menu items - discussed specific foods with pt Order small freq meals, snacks allowed TID   Elisabeth Cara M.Odis Luster LDN Neonatal Nutrition Support Specialist/RD III Pager [phone]      Phone [phone]

## 2018-08-13 LAB — CBC
HCT: 33.2 % — ABNORMAL LOW (ref 36.0–46.0)
Hemoglobin: 10.6 g/dL — ABNORMAL LOW (ref 12.0–15.0)
MCH: 30.9 pg (ref 26.0–34.0)
MCHC: 31.9 g/dL (ref 30.0–36.0)
MCV: 96.8 fL (ref 80.0–100.0)
PLATELETS: 562 10*3/uL — AB (ref 150–400)
RBC: 3.43 MIL/uL — ABNORMAL LOW (ref 3.87–5.11)
RDW: 14.1 % (ref 11.5–15.5)
WBC: 9.2 10*3/uL (ref 4.0–10.5)
nRBC: 0 % (ref 0.0–0.2)

## 2018-08-13 MED ORDER — IBUPROFEN 600 MG PO TABS: 600.0000 mg | ORAL_TABLET | Freq: Four times a day (QID) | ORAL | 0 refills | Status: DC | PRN

## 2018-08-13 MED ORDER — OXYCODONE-ACETAMINOPHEN 5-325 MG PO TABS: 1.0000 | ORAL_TABLET | ORAL | 0 refills | Status: DC | PRN

## 2018-08-13 NOTE — Discharge Summary (Signed)
Physician Discharge Summary  Patient ID: Claire Adams MRN: [id number] DOB/AGE: 02/19/1991 27 y.o.  Admit date: 08/10/2018 Discharge date: 08/13/2018  Admission Diagnoses:Left TOA, S/P laproscopic Right ov cystectomy, left TOA drainage, pelvic pain  Discharge Diagnoses: Same Active Problems:   Post-operative wound abscess   Discharged Condition: good  Hospital Course: Pt admitted for worsening pain after  laproscopic Right ov cystectomy, left TOA drainage 1 day prior and was on oral abx..  Her CT scan confirmed operative findings and was admitted for IV abx with zosyn.  Her WBC 16.5 on admission dropped to 9.2 and she remained afebrile throughout her hospital course.  Her pain improved and was able to eat, ambulate, had normal bowel function by HD3.  She was d/cd home with outpatient abx with fu in office in 3 days  Consults: None  Significant Diagnostic Studies: labs: WBC 9.2 08/13/18 CT see admission note  Treatments: antibiotics: Zosyn  Discharge Exam: Blood pressure (!) 89/46, pulse (!) 57, temperature 98.9 F (37.2 C), temperature source Oral, resp. rate 18, height 5' 8.5" (1.74 m), weight 55.8 kg, last menstrual period 07/22/2018, SpO2 99 %. General appearance: alert, cooperative, appears stated age and no distress GI: soft, non-tender; bowel sounds normal; no masses,  no organomegaly Incision/Wound: CD&I  Disposition: Discharge disposition: 01-Home or Self Care       Discharge Instructions    Call MD for:  difficulty breathing, headache or visual disturbances   Complete by:  As directed    Call MD for:  persistant nausea and vomiting   Complete by:  As directed    Call MD for:  redness, tenderness, or signs of infection (pain, swelling, redness, odor or green/yellow discharge around incision site)   Complete by:  As directed    Call MD for:  severe uncontrolled pain   Complete by:  As directed    Call MD for:  temperature >100.4   Complete by:  As directed    Diet general   Complete by:  As directed    Driving Restrictions   Complete by:  As directed    No driving for 2 weeks   Increase activity slowly   Complete by:  As directed         Signed: Turner Daniels 08/13/2018, 10:08 AM

## 2018-08-13 NOTE — Plan of Care (Signed)

## 2018-08-28 DIAGNOSIS — R1032 Left lower quadrant pain: Secondary | ICD-10-CM | POA: Diagnosis not present

## 2018-09-25 DIAGNOSIS — F330 Major depressive disorder, recurrent, mild: Secondary | ICD-10-CM | POA: Diagnosis not present

## 2018-10-23 DIAGNOSIS — R82998 Other abnormal findings in urine: Secondary | ICD-10-CM | POA: Diagnosis not present

## 2018-10-23 DIAGNOSIS — R102 Pelvic and perineal pain: Secondary | ICD-10-CM | POA: Diagnosis not present

## 2018-10-23 DIAGNOSIS — Z113 Encounter for screening for infections with a predominantly sexual mode of transmission: Secondary | ICD-10-CM | POA: Diagnosis not present

## 2018-10-23 DIAGNOSIS — Z3202 Encounter for pregnancy test, result negative: Secondary | ICD-10-CM | POA: Diagnosis not present

## 2018-10-23 DIAGNOSIS — N760 Acute vaginitis: Secondary | ICD-10-CM | POA: Diagnosis not present

## 2018-10-23 DIAGNOSIS — Z3042 Encounter for surveillance of injectable contraceptive: Secondary | ICD-10-CM | POA: Diagnosis not present

## 2018-10-24 DIAGNOSIS — R109 Unspecified abdominal pain: Secondary | ICD-10-CM | POA: Diagnosis not present

## 2018-10-24 DIAGNOSIS — R110 Nausea: Secondary | ICD-10-CM | POA: Diagnosis not present

## 2018-10-24 DIAGNOSIS — K5900 Constipation, unspecified: Secondary | ICD-10-CM | POA: Diagnosis not present

## 2018-10-25 DIAGNOSIS — R102 Pelvic and perineal pain: Secondary | ICD-10-CM | POA: Diagnosis not present

## 2018-10-28 ENCOUNTER — Encounter: Payer: Self-pay | Admitting: Internal Medicine

## 2018-10-28 ENCOUNTER — Other Ambulatory Visit: Payer: BLUE CROSS/BLUE SHIELD | Admitting: Internal Medicine

## 2018-10-28 ENCOUNTER — Ambulatory Visit (INDEPENDENT_AMBULATORY_CARE_PROVIDER_SITE_OTHER): Payer: BLUE CROSS/BLUE SHIELD | Admitting: Internal Medicine

## 2018-10-28 VITALS — BP 102/70 | HR 82 | Ht 68.25 in | Wt 123.0 lb

## 2018-10-28 DIAGNOSIS — K5900 Constipation, unspecified: Secondary | ICD-10-CM

## 2018-10-28 DIAGNOSIS — Z23 Encounter for immunization: Secondary | ICD-10-CM

## 2018-10-28 DIAGNOSIS — Z1321 Encounter for screening for nutritional disorder: Secondary | ICD-10-CM

## 2018-10-28 DIAGNOSIS — Z1329 Encounter for screening for other suspected endocrine disorder: Secondary | ICD-10-CM

## 2018-10-28 DIAGNOSIS — Z8742 Personal history of other diseases of the female genital tract: Secondary | ICD-10-CM

## 2018-10-28 DIAGNOSIS — Z8669 Personal history of other diseases of the nervous system and sense organs: Secondary | ICD-10-CM | POA: Diagnosis not present

## 2018-10-28 DIAGNOSIS — F419 Anxiety disorder, unspecified: Secondary | ICD-10-CM | POA: Diagnosis not present

## 2018-10-28 DIAGNOSIS — Z13220 Encounter for screening for lipoid disorders: Secondary | ICD-10-CM

## 2018-10-28 DIAGNOSIS — Z0000 Encounter for general adult medical examination without abnormal findings: Secondary | ICD-10-CM

## 2018-10-28 LAB — POCT URINALYSIS DIPSTICK
Appearance: NEGATIVE
Bilirubin, UA: NEGATIVE
Glucose, UA: NEGATIVE
Ketones, UA: NEGATIVE
Leukocytes, UA: NEGATIVE
Nitrite, UA: NEGATIVE
Odor: NEGATIVE
Protein, UA: NEGATIVE
Spec Grav, UA: 1.01
Urobilinogen, UA: 0.2 U/dL
pH, UA: 6.5

## 2018-10-28 MED ORDER — CLONAZEPAM 0.5 MG PO TABS: ORAL_TABLET | ORAL | 1 refills | Status: DC

## 2018-10-28 NOTE — Progress Notes (Signed)
   Subjective:    Patient ID: Claire Adams, female    DOB: [birth year], 28 y.o.   MRN: [id number]  HPI 28 year old Female for health maintenance exam and evaluation of medical issues.  In September 2019 ,presented to [facility] emergency department with abdominal pain.  CT showed a left adnexal cystic area measuring 4.6 x 3.2 cm. Subsequently had right ovarian cystectomy and left TOA drainage done at a surgical center on November 1.  She presented to [facility] emergency department with severe abdominal pain on November 2 and was admitted for 3 days.  White count was 16,500 on admission and decreased to normal with IV Zosyn. She had  a [place] IUD placed in the Spring 2019 per old records She is recovered well and is doing okay.  She has history of anxiety disorder  Has had flu vaccine for this season.  Apparently was found to have a right carotid bruit when being evaluated for migraine in 2018 by Dr. Amil Amen at [facility] and had negative carotid Dopplers.  At that time she was asked to consider discontinuing birth control.  Was advised to take Excedrin as needed for migraine headaches.    Recently had onset vomiting and diarrhea over the weekend  Recently saw saw Gastroenterologist Dr. Collene Mares who thought she was constipated last week and asked to take Colace.  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  Has a steady boyfriend who is a Agricultural consultant.  Student at [employer].  Single never married.  Usually has 1 alcoholic drink a week.  Does not smoke.  Family history: Father age 72 with history of hypertension otherwise in good health and mother age 43 in good health.  One brother age 24 in good health.  Review of Systems says she has lost  20 pounds in the past year     Objective:   Physical Exam Vital signs reviewed.  Skin warm and dry.  Nodes none.  TMs and pharynx are clear.  Neck is supple without adenopathy or thyromegaly.  Chest is clear to auscultation.  Cardiac exam regular rate and rhythm normal S1 and S2 without murmurs.  Abdomen no hepatosplenomegaly masses or significant tenderness  Pelvic exam deferred to GYN  Extremities without deformity  Neuro exam: No focal deficits on brief exam       Assessment & Plan:  History of recent right ovarian cystectomy and left TOA requiring hospitalization for abdominal pain and leukocytosis treated with IV Zosyn.  Now on Depo-Provera for contraception.  IUD Kyleena removed.  History of anxiety disorder- patient says she becomes stressed easily.  Relates this to some issues with parents not getting along well some time ago and getting anxious with deadlines.  We will try Klonopin 0.5 mg at onset of anxiety attack.  History of migraine headaches  Constipation-seeing Dr. Collene Mares  Plan: Labs reviewed and are within normal limits including TSH, vitamin D, lipid panel, C met, CBC and dipstick UA.  Return in 1 year or as needed.  Tdap vaccine update given.  Time spent evaluating patient included some 25 minutes reviewing old medical records ER visit, admission to [facility] [place] records in addition to [place] records.

## 2018-10-29 LAB — CBC WITH DIFFERENTIAL/PLATELET
ABSOLUTE MONOCYTES: 399 {cells}/uL (ref 200–950)
Basophils Absolute: 8 cells/uL (ref 0–200)
Basophils Relative: 0.2 %
Eosinophils Absolute: 21 cells/uL (ref 15–500)
Eosinophils Relative: 0.5 %
HEMATOCRIT: 44.3 % (ref 35.0–45.0)
Hemoglobin: 15.3 g/dL (ref 11.7–15.5)
LYMPHS ABS: 1844 {cells}/uL (ref 850–3900)
MCH: 31.7 pg (ref 27.0–33.0)
MCHC: 34.5 g/dL (ref 32.0–36.0)
MCV: 91.7 fL (ref 80.0–100.0)
MPV: 9.6 fL (ref 7.5–12.5)
Monocytes Relative: 9.5 %
NEUTROS PCT: 45.9 %
Neutro Abs: 1928 cells/uL (ref 1500–7800)
Platelets: 244 10*3/uL (ref 140–400)
RBC: 4.83 10*6/uL (ref 3.80–5.10)
RDW: 12.6 % (ref 11.0–15.0)
Total Lymphocyte: 43.9 %
WBC: 4.2 10*3/uL (ref 3.8–10.8)

## 2018-10-29 LAB — LIPID PANEL
Cholesterol: 132 mg/dL (ref <200)
HDL: 53 mg/dL (ref >50)
LDL Cholesterol (Calc): 65 mg/dL (calc)
Non-HDL Cholesterol (Calc): 79 mg/dL (calc) (ref <130)
Total CHOL/HDL Ratio: 2.5 (calc) (ref <5.0)
Triglycerides: 60 mg/dL (ref <150)

## 2018-10-29 LAB — COMPLETE METABOLIC PANEL WITH GFR
AG Ratio: 1.8 (calc) (ref 1.0–2.5)
ALBUMIN MSPROF: 4.4 g/dL (ref 3.6–5.1)
ALKALINE PHOSPHATASE (APISO): 45 U/L (ref 33–115)
ALT: 8 U/L (ref 6–29)
AST: 12 U/L (ref 10–30)
BUN: 15 mg/dL (ref 7–25)
CHLORIDE: 106 mmol/L (ref 98–110)
CO2: 23 mmol/L (ref 20–32)
Calcium: 9.3 mg/dL (ref 8.6–10.2)
Creat: 0.61 mg/dL (ref 0.50–1.10)
GFR, Est African American: 146 mL/min/{1.73_m2} (ref >=60)
GFR, Est Non African American: 126 mL/min/{1.73_m2} (ref >=60)
GLUCOSE: 81 mg/dL (ref 65–99)
Globulin: 2.4 g/dL (calc) (ref 1.9–3.7)
Potassium: 3.7 mmol/L (ref 3.5–5.3)
SODIUM: 140 mmol/L (ref 135–146)
TOTAL PROTEIN: 6.8 g/dL (ref 6.1–8.1)
Total Bilirubin: 0.4 mg/dL (ref 0.2–1.2)

## 2018-10-29 LAB — TSH: TSH: 1.31 mIU/L

## 2018-10-29 LAB — VITAMIN D 25 HYDROXY (VIT D DEFICIENCY, FRACTURES): Vit D, 25-Hydroxy: 33 ng/mL (ref 30–100)

## 2018-11-03 DIAGNOSIS — R6889 Other general symptoms and signs: Secondary | ICD-10-CM | POA: Diagnosis not present

## 2018-11-03 DIAGNOSIS — H1032 Unspecified acute conjunctivitis, left eye: Secondary | ICD-10-CM | POA: Diagnosis not present

## 2018-11-03 DIAGNOSIS — F419 Anxiety disorder, unspecified: Secondary | ICD-10-CM | POA: Insufficient documentation

## 2018-11-03 DIAGNOSIS — J029 Acute pharyngitis, unspecified: Secondary | ICD-10-CM | POA: Diagnosis not present

## 2018-11-03 DIAGNOSIS — J039 Acute tonsillitis, unspecified: Secondary | ICD-10-CM | POA: Diagnosis not present

## 2018-11-03 DIAGNOSIS — H6692 Otitis media, unspecified, left ear: Secondary | ICD-10-CM | POA: Diagnosis not present

## 2018-11-03 NOTE — Patient Instructions (Addendum)
It was a pleasure to see you today.  Lab work is entirely within normal limits.  Try Klonopin at onset of anxiety attack.  Tetanus immunization update given.

## 2018-11-21 DIAGNOSIS — J00 Acute nasopharyngitis [common cold]: Secondary | ICD-10-CM | POA: Diagnosis not present

## 2018-12-07 IMAGING — CT CT ABD-PELV W/ CM
1 of 4 series · 14 of 32 positions shown, 18 images · IV contrast (OMNIPAQUE)
Comparison: CT of the abdomen pelvis dated 07/04/2018

CLINICAL DATA: 27-year-old female status post recent bilateral
ovarian cystectomy presenting with worsening abdominal pain.

EXAM:
CT ABDOMEN AND PELVIS WITH CONTRAST
TECHNIQUE: Multidetector CT imaging of the abdomen and pelvis was performed
using the standard protocol following bolus administration of
intravenous contrast.
CONTRAST:  75mL DQSPM2-SRR IOPAMIDOL (DQSPM2-SRR) INJECTION 61%

[Series 4: (person_name) thins · axial · 0.53mm/px · z∈[+770,+1178]mm · 14 of 665 slices shown, 18 images]
[im 54/665  soft-tissue]
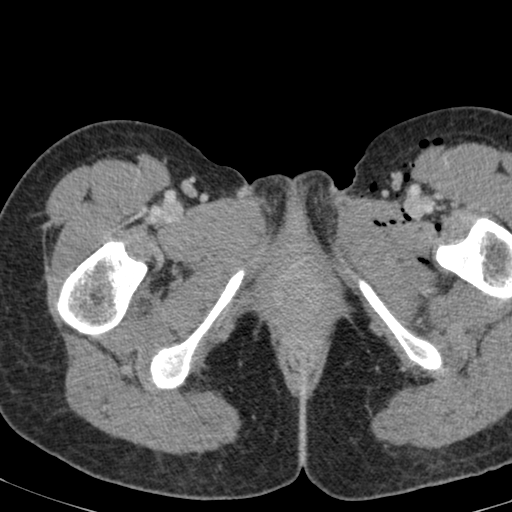
[im 54/665  bone]
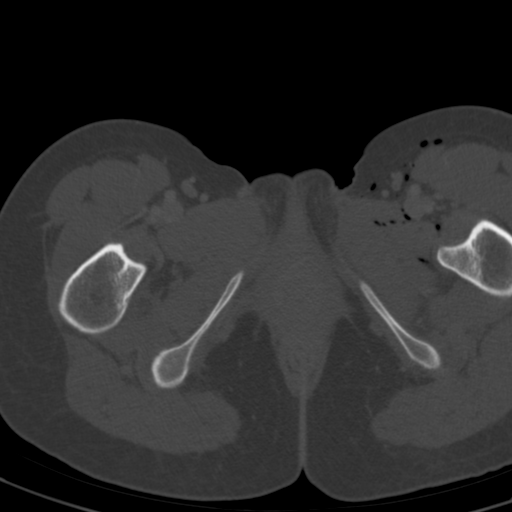
[im 107/665  soft-tissue]
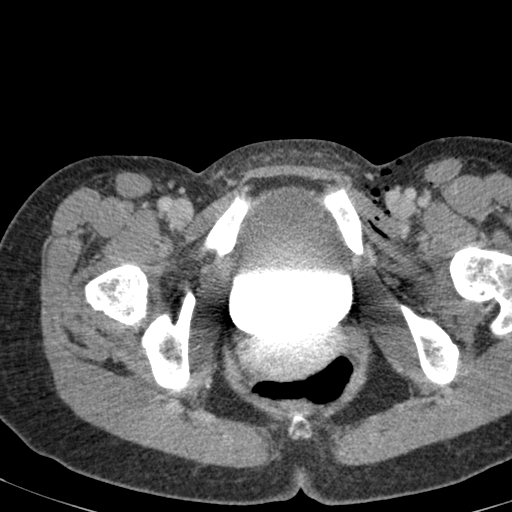
[im 160/665  soft-tissue]
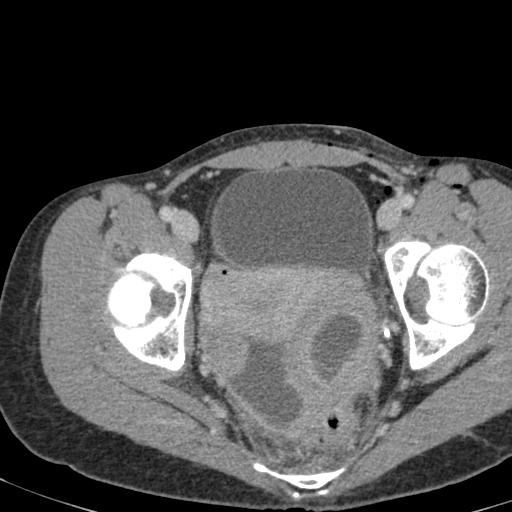
[im 213/665  soft-tissue]
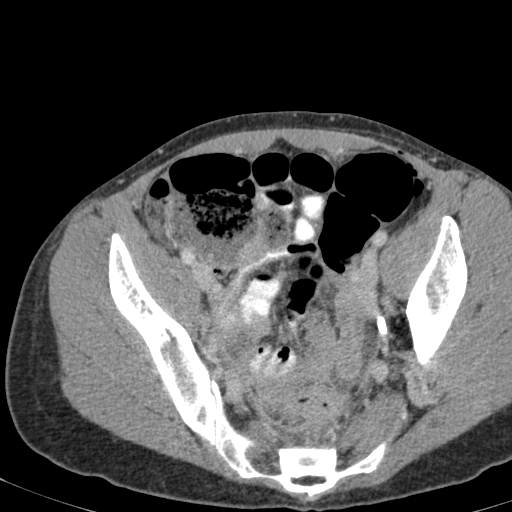
[im 266/665  soft-tissue]
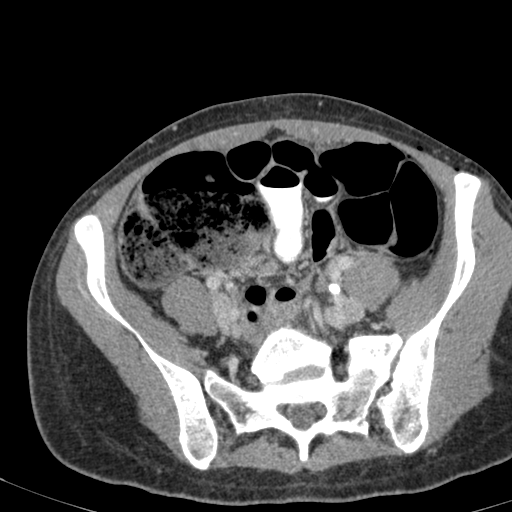
[im 319/665  soft-tissue]
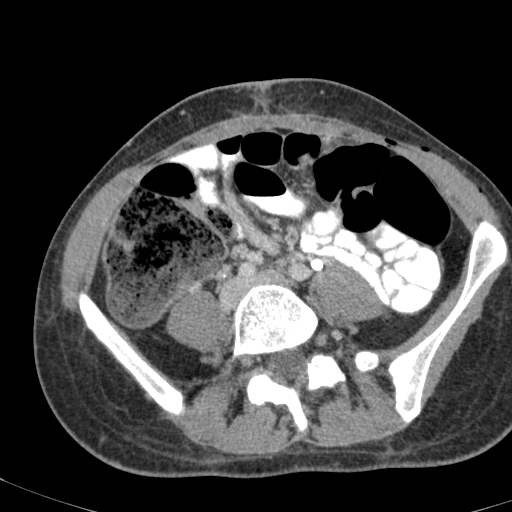
[im 372/665  soft-tissue]
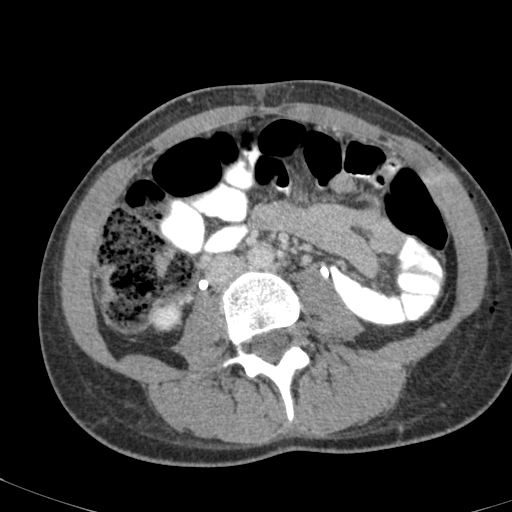
[im 425/665  soft-tissue]
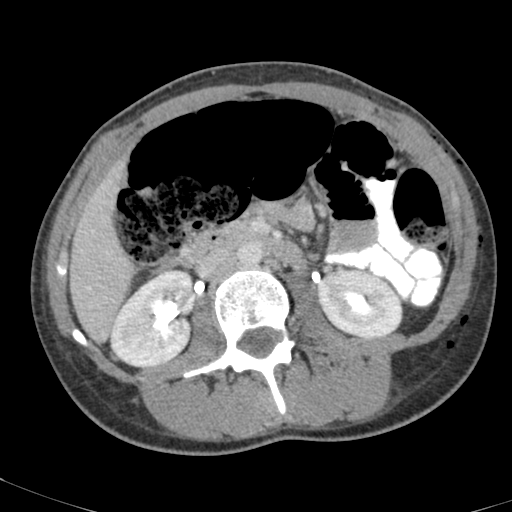
[im 479/665  soft-tissue]
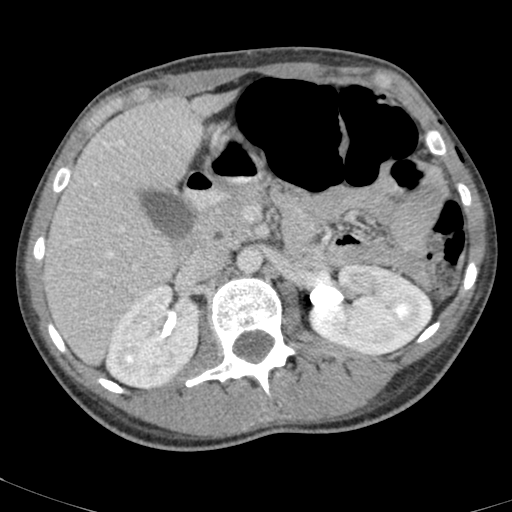
[im 479/665  bone]
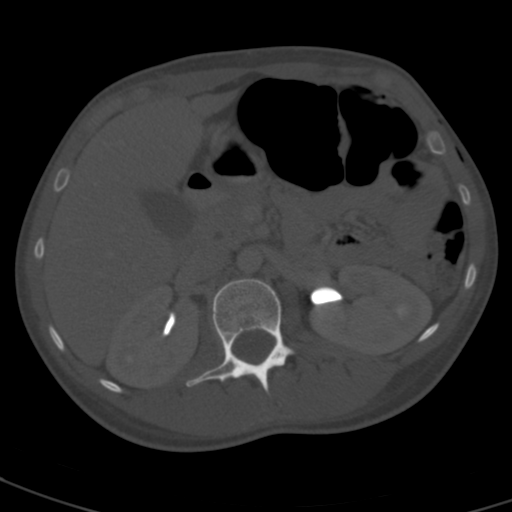
[im 532/665  soft-tissue]
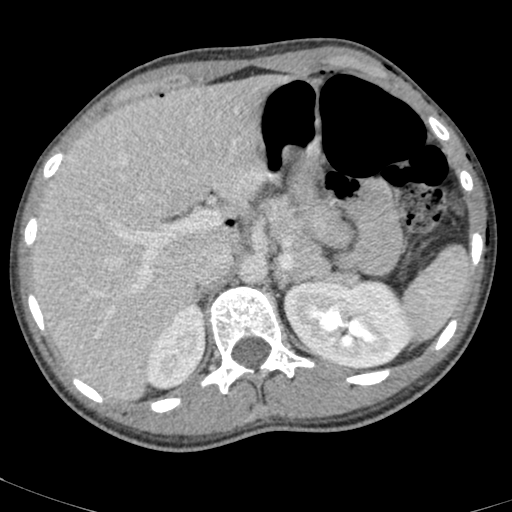
[im 558/665  lung]
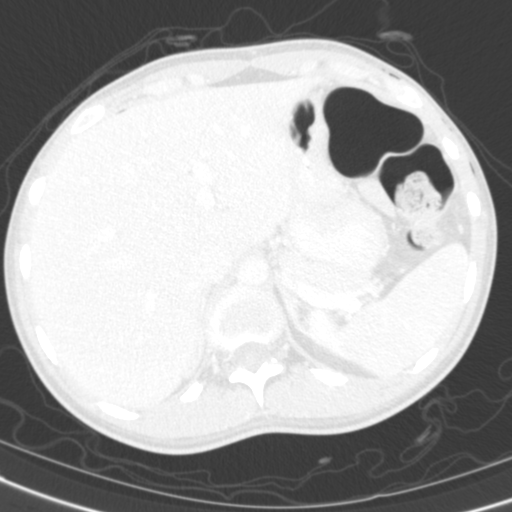
[im 585/665  soft-tissue]
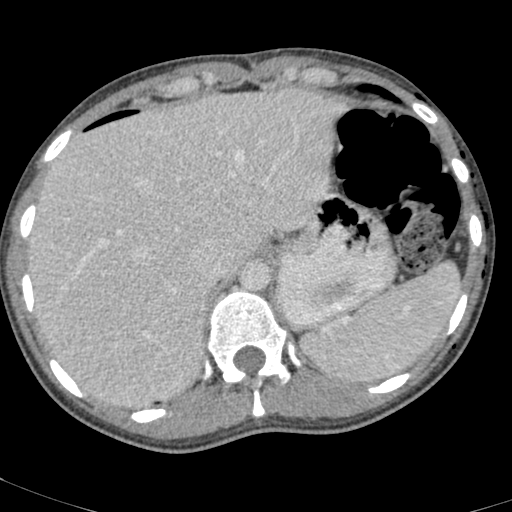
[im 585/665  lung]
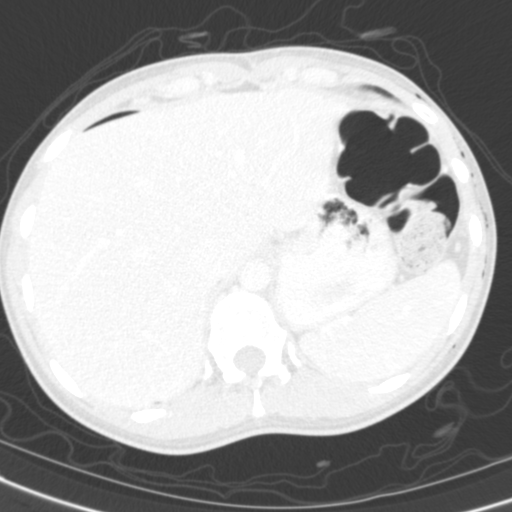
[im 611/665  lung]
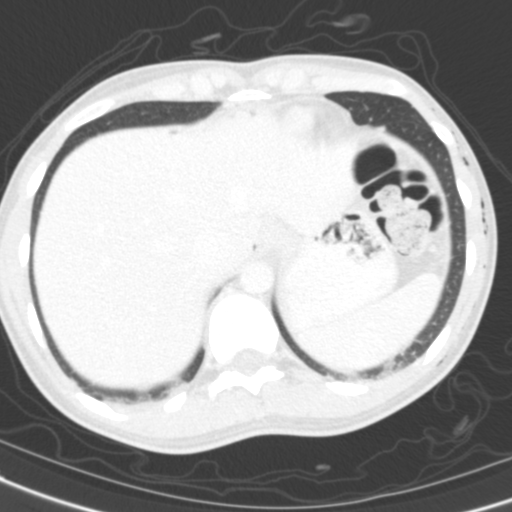
[im 638/665  soft-tissue]
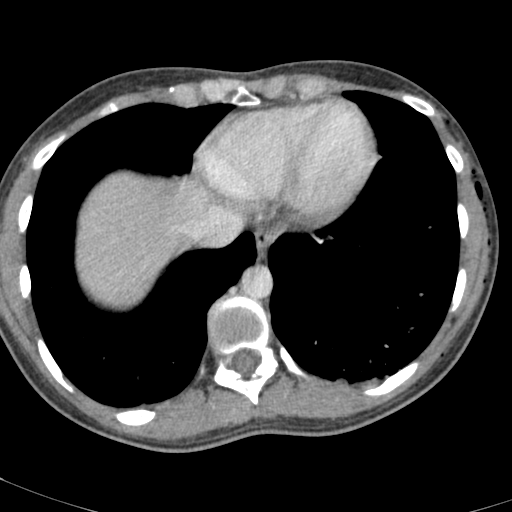
[im 638/665  lung]
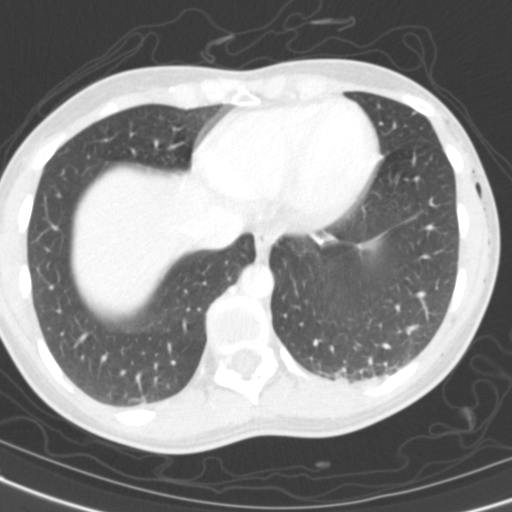

[14 of 32 positions shown; findings below may reference images not displayed]

FINDINGS: Lower chest: Minimal bibasilar dependent atelectatic changes. The
visualized lung bases are otherwise clear.

There is small scattered intraperitoneal air consistent with
provided history of recent surgery. No significant free fluid.

Hepatobiliary: No focal liver abnormality is seen. No gallstones,
gallbladder wall thickening, or biliary dilatation.

Pancreas: Unremarkable. No pancreatic ductal dilatation or
surrounding inflammatory changes.

Spleen: Normal in size without focal abnormality.

Adrenals/Urinary Tract: Adrenal glands are unremarkable. Kidneys are
normal, without renal calculi, focal lesion, or hydronephrosis.
Bladder is unremarkable.

Stomach/Bowel: Mild thickened appearance of the rectosigmoid, likely
reactive to inflammatory changes of the pelvis. There is no bowel
obstruction. There is moderate stool throughout the colon. The
appendix is normal.

Vascular/Lymphatic: The abdominal aorta and IVC are unremarkable.
The no portal venous gas. There is no adenopathy.

Reproductive: The uterus is anteverted and grossly unremarkable as
visualized. There is a loculated appearing fluid collection
measuring 3.3 x 3.6 cm in the left hemipelvis containing a small
pocket of air. A rind of soft tissue surrounding this fluid
collection likely represents the left ovarian tissue. This fluid
collection appears to be in the same area as the previously seen
left ovarian cyst and likely corresponds to the cyst seen
previously. There is a focal area of discontinuity in the wall of
this fluid collection with extension of the fluid into the right
posterior pelvis. This fluid appears to communicate with a 2.5 x
cm fluid in the right hemipelvis which also appears loculated and
surrounds the right ovary. Overall the amount of pelvic fluid have
increased since the prior CT of 07/04/2018. Loculated appearance
with enhancement of the capsule suggest a superimposed infectious
process or developing abscess. Clinical correlation is recommended.

Other: None

Musculoskeletal: There are pockets of soft tissue air in the left
groin and medial upper left thigh. Stranding of the periumbilical
subcutaneous soft tissues likely related to laparoscopic port
placement. The osseous structures are intact.
IMPRESSION: 1. Loculated fluids within the pelvis as described may represent
rupture of the left ovarian cyst with overall hyperemia of the
surrounding tissue/capsule and findings concerning for superimposed
infection or developing abscess.
2. Small pockets of intraperitoneal air, postoperative.

## 2019-01-13 ENCOUNTER — Ambulatory Visit (INDEPENDENT_AMBULATORY_CARE_PROVIDER_SITE_OTHER): Payer: BLUE CROSS/BLUE SHIELD | Admitting: Internal Medicine

## 2019-01-13 ENCOUNTER — Other Ambulatory Visit: Payer: Self-pay

## 2019-01-13 ENCOUNTER — Ambulatory Visit
Admission: RE | Admit: 2019-01-13 | Discharge: 2019-01-13 | Disposition: A | Discharge status: Home / Self Care | Payer: BLUE CROSS/BLUE SHIELD | Source: Ambulatory Visit | Attending: Internal Medicine | Admitting: Internal Medicine

## 2019-01-13 ENCOUNTER — Encounter: Payer: Self-pay | Admitting: Internal Medicine

## 2019-01-13 DIAGNOSIS — K5909 Other constipation: Secondary | ICD-10-CM | POA: Diagnosis not present

## 2019-01-13 DIAGNOSIS — R1013 Epigastric pain: Secondary | ICD-10-CM

## 2019-01-13 DIAGNOSIS — R1010 Upper abdominal pain, unspecified: Secondary | ICD-10-CM

## 2019-01-13 MED ORDER — HYOSCYAMINE SULFATE SL 0.125 MG SL SUBL: SUBLINGUAL_TABLET | SUBLINGUAL | 1 refills | Status: DC

## 2019-01-13 NOTE — Patient Instructions (Addendum)
Levsin 0.125 mg before breakfast and supper.  Have KUB flat and upright abdominal film today.  Addendum: Take Dulcolax tablets p.o. for constipation and then start MiraLAX: 8 ounces of liquid daily.  For pain may take Levsin.

## 2019-01-13 NOTE — Progress Notes (Addendum)
   Subjective:    Patient ID: Claire Adams, female    DOB: 1991-02-24, 28 y.o.   MRN: [id number]  HPI 28 year old Female seen today by WebEx visit for evaluation of complaint of epigastric pain.  She notified me yesterday by patient email that she was having discomfort particularly with meals.  She denies being constipated.  Is having 1 bowel movement daily.  She has a history of anxiety and panic disorder and has been prescribed Klonopin sparingly.  She has not taken that to see if that would help with the pain.  Patient seen by WebEx.  Interactive audio and video telecommunications were available and worked well.  Identified as Claire Adams date of birth [birth year].  She has a history of right ovarian cystectomy and left TOA drainage done by GYN physician on November 1.  She denies fever.  No vomiting.  Appetite is okay.  Due to her prior history of GYN issues, she is concerned.  He had a CT of the abdomen and pelvis with contrast in November and at that point had no gallstones.  There was no inflammation of the pancreas.  Adrenals were unremarkable.  There was no bowel obstruction at the time.  This is when she had what was thought to be a ruptured left ovarian cyst.  Review of Systems no nausea or vomiting.  Denies obstipation or abdominal bloating.  Pain is associated with meals.     Objective:   Physical Exam Patient indicates she is afebrile.  She is seen by WebEx appointment today.  She appears to be in no acute distress at this point in time.  Presses on her upper abdomen at my request and does not appear to have any acute tenderness is witnessed on WebEx visit.  She appears to be in no acute distress.  She is able to give a clear concise history.       Assessment & Plan:  Epigastric pain-it is unlikely she has developed gallstones in the short period of time.  I think it is more likely that she has constipation/obstipation or irritable bowel symptoms.  I am going to prescribe  Levsin 0.125 mg before breakfast and supper to see if this will help her pain.  She will have KUB flat and upright to see if she is constipated or has evidence of free air under the diaphragm.  She reports she is afebrile.  Addendum: KUB shows stool burden.  Recommend Dulcolax 2 tablets p.o. followed by starting MiraLAX tomorrow.  1 capful in 8 ounces of liquid daily.  Take Levsin sparingly for pain

## 2019-01-15 DIAGNOSIS — Z3042 Encounter for surveillance of injectable contraceptive: Secondary | ICD-10-CM | POA: Diagnosis not present

## 2019-02-04 ENCOUNTER — Other Ambulatory Visit: Payer: Self-pay | Admitting: Internal Medicine

## 2019-02-24 ENCOUNTER — Ambulatory Visit (INDEPENDENT_AMBULATORY_CARE_PROVIDER_SITE_OTHER): Payer: BLUE CROSS/BLUE SHIELD | Admitting: Internal Medicine

## 2019-02-24 ENCOUNTER — Encounter: Payer: Self-pay | Admitting: Internal Medicine

## 2019-02-24 DIAGNOSIS — K5904 Chronic idiopathic constipation: Secondary | ICD-10-CM | POA: Diagnosis not present

## 2019-02-24 DIAGNOSIS — F419 Anxiety disorder, unspecified: Secondary | ICD-10-CM

## 2019-02-24 MED ORDER — LUBIPROSTONE 8 MCG PO CAPS: 8.0000 ug | ORAL_CAPSULE | Freq: Two times a day (BID) | ORAL | 0 refills | Status: DC

## 2019-02-24 NOTE — Progress Notes (Signed)
   Subjective:    Patient ID: Claire Adams, female    DOB: 1991-01-07, 28 y.o.   MRN: [id number]  HPI 28 year old Female with longstanding history of obstipation and constipation seen today by interactive audio and video telecommunications due to the coronavirus pandemic.  She is agreeable to visit in this format today.  She is identified using 2 identifiers as Claire, Adams patient in this practice.  Patient took some Dulcolax recently about a week ago and had good results from that with lots of stool being produced.  She has been taking MiraLAX daily for about a week but stopped because the directions indicated not to take it for more than a week.  She failed understand that she could take this indefinitely.  Now feels bloated again.  Is asking about something for bloating.  However if we give her an anticholinergic for bloating then she will likely have more issues with constipation.  But we really need is for her to have regular bowel movements.  Dulcolax caused a lot of cramping for her which is not unusual.  She is concerned because she had surgery for right ovarian cystectomy and left TOA drainage in November.  I do not think this issue is related to that.  She is able to have some bowel movements just not complete bowel movements.  She has a history of anxiety.    In April we prescribe Levsin for abdominal cramping and epigastric pain.  She had a KUB flat and upright film April 6 that showed mild to moderate increased stool throughout the colon.  Planning to go out of town for a week beginning May 21.  Her bloating improved when she took Dulcolax 2 tablets p.o. but then bloating returned.  She is having some lower abdominal pain.  Stools are not solid and occur about every other day but does not feel that she defecates completely.  She has tried probiotics, exercise, drinking coffee and lots of water without much success.    Review of Systems no vomiting no fever no chills     Objective:   Physical Exam  Unable to examine but by history she is having abdominal bloating and incomplete defecation.  Is afebrile by history      Assessment & Plan:  Functional constipation  History of anxiety  Plan: Dulcolax is causing considerable cramping. She was previously told to take MiraLAX but only took it for about a week.  I am suggesting that she take Amitiza 8 mcg twice daily but she will need to get cleaned out with Dulcolax once more before starting the amities a.  She should see some relief within 5 days from the Amitiza.  If symptoms are not improving, we will refer her to Gastroenterologist  25 minutes spent with patient including reviewing previous records, history taking, medical decision making, medication prescribing and prior authorization of Amitiza

## 2019-02-24 NOTE — Patient Instructions (Signed)
We will try to get Amitiza prior authorized for you.  Take Dulcolax 2 tablets then start amities a 8 mcg twice daily.  May discontinue MiraLAX.  May continue with probiotics.

## 2019-03-20 ENCOUNTER — Other Ambulatory Visit: Payer: Self-pay | Admitting: Internal Medicine

## 2019-03-20 NOTE — Telephone Encounter (Signed)
Left message to me back.

## 2019-03-20 NOTE — Telephone Encounter (Signed)
Please call her and see if it is working and does she want it refilled

## 2019-03-31 DIAGNOSIS — J309 Allergic rhinitis, unspecified: Secondary | ICD-10-CM | POA: Diagnosis not present

## 2019-04-01 ENCOUNTER — Ambulatory Visit (INDEPENDENT_AMBULATORY_CARE_PROVIDER_SITE_OTHER): Payer: BC Managed Care – PPO | Admitting: Internal Medicine

## 2019-04-01 ENCOUNTER — Telehealth: Payer: Self-pay | Admitting: Internal Medicine

## 2019-04-01 DIAGNOSIS — K5904 Chronic idiopathic constipation: Secondary | ICD-10-CM

## 2019-04-01 DIAGNOSIS — Z8669 Personal history of other diseases of the nervous system and sense organs: Secondary | ICD-10-CM

## 2019-04-01 DIAGNOSIS — F419 Anxiety disorder, unspecified: Secondary | ICD-10-CM

## 2019-04-01 NOTE — Progress Notes (Signed)
   Subjective:    Patient ID: JAANA BRODT, female    DOB: 06/24/1991, 28 y.o.   MRN: [id number]  HPI 28 year old Female with history of anxiety, migraine headaches, functional constipation seen today by interactive audio and video telecommunications due to the coronavirus pandemic.  She is identified as Sallee Lange, using 2 identifiers and is a patient in this practice.  She is agreeable to visit in this format today.  She has a history of functional constipation which is longstanding.  She messaged me recently saying that [place] worked great but was terribly expensive.  We do have some coupons we can provide for her.  However she thinks she would like to try to manage this on her own.  We talked about MiraLAX but she only took it for about a week previously.  She does have a history of migraine headaches and was evaluated weighted by a neurologist in [place].  I reminded her about this today.  There is no relationship to her migraines that I know of with functional constipation or the medication Amitiza.  Has been on Depo-Provera for birth control.  Reminded her about foods that could cause migraine headaches.    Review of Systems see above     Objective:   Physical Exam  Seen virtually and not examined      Assessment & Plan:  Functional constipation if she cannot afford Amitiza and then she should go back to MiraLAX on a regular basis.  A coupon will be mailed to her.  If she would like to see a gastroenterologist, we can arrange that  History of migraine headaches, likely would respond to triptan medication but she would like to manage this more conservatively with headache diary and watching her food intake.  Return PRN.  Her physical exam is due January 2021.

## 2019-04-01 NOTE — Telephone Encounter (Signed)
Faxed office notes to Dr [facility] office notes.  Mailed Amitiza Savings Card

## 2019-04-06 ENCOUNTER — Encounter: Payer: Self-pay | Admitting: Internal Medicine

## 2019-04-06 DIAGNOSIS — K5904 Chronic idiopathic constipation: Secondary | ICD-10-CM | POA: Insufficient documentation

## 2019-04-06 DIAGNOSIS — Z8669 Personal history of other diseases of the nervous system and sense organs: Secondary | ICD-10-CM | POA: Insufficient documentation

## 2019-04-06 NOTE — Patient Instructions (Signed)
If you cannot afford Amitiza, take MiraLAX.  Keep headache diary.  Try to keep track of events or foods that might cause migraines.  We can refer you to gastroenterologist if constipation continues to be an issue.  We can prescribe medication that will help you with migraines if you would like.  Physical exam due January 2021

## 2019-04-13 ENCOUNTER — Other Ambulatory Visit: Payer: Self-pay

## 2019-04-13 ENCOUNTER — Emergency Department (HOSPITAL_COMMUNITY)
Admission: EM | Admit: 2019-04-13 | Discharge: 2019-04-14 | Disposition: A | Discharge status: Home / Self Care | Payer: BC Managed Care – PPO | Attending: Emergency Medicine | Admitting: Emergency Medicine

## 2019-04-13 ENCOUNTER — Encounter (HOSPITAL_COMMUNITY): Payer: Self-pay | Admitting: Emergency Medicine

## 2019-04-13 DIAGNOSIS — R112 Nausea with vomiting, unspecified: Secondary | ICD-10-CM | POA: Diagnosis not present

## 2019-04-13 DIAGNOSIS — Z79899 Other long term (current) drug therapy: Secondary | ICD-10-CM | POA: Insufficient documentation

## 2019-04-13 DIAGNOSIS — R1013 Epigastric pain: Secondary | ICD-10-CM | POA: Diagnosis not present

## 2019-04-13 DIAGNOSIS — R197 Diarrhea, unspecified: Secondary | ICD-10-CM | POA: Insufficient documentation

## 2019-04-13 LAB — COMPREHENSIVE METABOLIC PANEL
ALT: 20 U/L (ref 0–44)
AST: 18 U/L (ref 15–41)
Albumin: 4.9 g/dL (ref 3.5–5.0)
Alkaline Phosphatase: 56 U/L (ref 38–126)
Anion gap: 10 (ref 5–15)
BUN: 13 mg/dL (ref 6–20)
CO2: 25 mmol/L (ref 22–32)
Calcium: 9.5 mg/dL (ref 8.9–10.3)
Chloride: 104 mmol/L (ref 98–111)
Creatinine, Ser: 0.53 mg/dL (ref 0.44–1.00)
GFR calc Af Amer: >60 mL/min (ref >60)
GFR calc non Af Amer: >60 mL/min (ref >60)
Glucose, Bld: 109 mg/dL — ABNORMAL HIGH (ref 70–99)
Potassium: 3.8 mmol/L (ref 3.5–5.1)
Sodium: 139 mmol/L (ref 135–145)
Total Bilirubin: 0.5 mg/dL (ref 0.3–1.2)
Total Protein: 7.6 g/dL (ref 6.5–8.1)

## 2019-04-13 LAB — URINALYSIS, ROUTINE W REFLEX MICROSCOPIC
Bilirubin Urine: NEGATIVE
Glucose, UA: NEGATIVE mg/dL
Ketones, ur: NEGATIVE mg/dL
Nitrite: NEGATIVE
Protein, ur: 100 mg/dL — AB
RBC / HPF: >50 RBC/hpf — ABNORMAL HIGH (ref 0–5)
Specific Gravity, Urine: 1.011 (ref 1.005–1.030)
WBC, UA: >50 WBC/hpf — ABNORMAL HIGH (ref 0–5)
pH: 5 (ref 5.0–8.0)

## 2019-04-13 LAB — CBC
HCT: 44.9 % (ref 36.0–46.0)
Hemoglobin: 14.8 g/dL (ref 12.0–15.0)
MCH: 32.6 pg (ref 26.0–34.0)
MCHC: 33 g/dL (ref 30.0–36.0)
MCV: 98.9 fL (ref 80.0–100.0)
Platelets: 240 10*3/uL (ref 150–400)
RBC: 4.54 MIL/uL (ref 3.87–5.11)
RDW: 12 % (ref 11.5–15.5)
WBC: 10 10*3/uL (ref 4.0–10.5)
nRBC: 0 % (ref 0.0–0.2)

## 2019-04-13 LAB — LIPASE, BLOOD: Lipase: 29 U/L (ref 11–51)

## 2019-04-13 LAB — I-STAT BETA HCG BLOOD, ED (MC, WL, AP ONLY): I-stat hCG, quantitative: <5 m[IU]/mL (ref <5)

## 2019-04-13 MED ORDER — SODIUM CHLORIDE 0.9% FLUSH: 3.0000 mL | Freq: Once | INTRAVENOUS | Status: DC

## 2019-04-13 NOTE — ED Notes (Signed)
Bed: WTR8 Expected date:  Expected time:  Means of arrival:  Comments: 

## 2019-04-13 NOTE — ED Triage Notes (Signed)
Patient c/o abdominal pain with nausea and diarrhea x1 hour. Reports pain began as generalized abdominal pain, then localized to RUQ.

## 2019-04-14 DIAGNOSIS — B349 Viral infection, unspecified: Secondary | ICD-10-CM | POA: Diagnosis not present

## 2019-04-14 NOTE — Discharge Instructions (Addendum)
Take omeprazole (Prilosec OTC) once a day.  Take antacids as needed.  Take loperamide (Imodium AD) as needed for diarrhea.  Return if pain is getting worse.

## 2019-04-14 NOTE — ED Provider Notes (Signed)
[facility]-EMERGENCY DEPT Provider Note   CSN: [id number] Arrival date & time: 04/13/19  2005    History   Chief Complaint Chief Complaint  Patient presents with  . Abdominal Pain  . Diarrhea    HPI Claire Adams is a 28 y.o. female.   The history is provided by the patient.  She has history of migraines and functional constipation and comes in because of abdominal pain.  She had onset about 6 PM of epigastric pain without radiation.  Pain was severe.  There is associated nausea, vomiting, diarrhea.  She no longer is having any nausea and is no longer feeling like she is going to have more diarrhea.  She did not take any medication for her symptoms.  She denies tobacco and ethanol use.  Past Medical History:  Diagnosis Date  . Broken arm 2017   Right elbow per pt  . 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 Ovarian cyst     Patient Active Problem List   Diagnosis Date Noted  . History of migraine headaches 04/06/2019  . 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 Anxiety 11/03/2018    Past Surgical History:  Procedure Laterality Date  . bilateral ovarian cystectomy Bilateral 2019  . broke left arm Left 2006  . broke right arm  2018  . LAPAROSCOPIC OVARIAN CYSTECTOMY Bilateral   . severe ligament tear right foot    . WISDOM TOOTH EXTRACTION       OB History    Gravida  0   Para  0   Term  0   Preterm  0   AB  0   Living  0     SAB  0   TAB  0   Ectopic  0   Multiple  0   Live Births  0            Home Medications    Prior to Admission medications   Medication Sig Start Date End Date Taking? Authorizing Provider  medroxyPROGESTERone (DEPO-PROVERA) 150 MG/ML injection Inject 150 mg into the muscle every 3 (three) months.   Yes [provider]    Family History Family History  Problem Relation Age of Onset  . Healthy Mother   . Arthritis Mother   . Healthy Father   .  Hypertension Father   . Arthritis Maternal Grandmother   . Stroke Paternal Grandmother     Social History Social History   Tobacco Use  . Smoking status: Never Smoker  . Smokeless tobacco: Never Used  Substance Use Topics  . Alcohol use: Yes  . Drug use: Never     Allergies   Patient has no known allergies.   Review of Systems Review of Systems  All other systems reviewed and are negative.    Physical Exam Updated Vital Signs BP 117/85 (BP Location: Left Arm)   Pulse 89   Temp 98.4 F (36.9 C) (Oral)   Resp 18   Ht 5\' 8"  (1.727 m)   Wt 60.3 kg   SpO2 99%   BMI 20.22 kg/m   Physical Exam Vitals signs and nursing note reviewed.    28 year old female, appears uncomfortable, but is in no acute distress. Vital signs are normal. Oxygen saturation is 99%, which is normal. Head is normocephalic and atraumatic. PERRLA, EOMI. Oropharynx is clear. Neck is nontender and supple without adenopathy or JVD. Back is nontender and there is no CVA tenderness.  Lungs are clear without rales, wheezes, or rhonchi. Chest is nontender. Heart has regular rate and rhythm without murmur. Abdomen is soft, flat, with mild epigastric tenderness.  There is no rebound or guarding.  There are no masses or hepatosplenomegaly and peristalsis is normoactive. Extremities have no cyanosis or edema, full range of motion is present. Skin is warm and dry without rash. Neurologic: Mental status is normal, cranial nerves are intact, there are no motor or sensory deficits.  ED Treatments / Results  Labs (all labs ordered are listed, but only abnormal results are displayed) Labs Reviewed  COMPREHENSIVE METABOLIC PANEL - Abnormal; Notable for the following components:      Result Value   Glucose, Bld 109 (*)    All other components within normal limits  URINALYSIS, ROUTINE W REFLEX MICROSCOPIC - Abnormal; Notable for the following components:   APPearance HAZY (*)    Hgb urine dipstick LARGE (*)     Protein, ur 100 (*)    Leukocytes,Ua MODERATE (*)    RBC / HPF >50 (*)    WBC, UA >50 (*)    Bacteria, UA RARE (*)    All other components within normal limits  LIPASE, BLOOD  CBC  I-STAT BETA HCG BLOOD, ED ([facility], [facility], [facility] ONLY)    Procedures Procedures   Medications Ordered in ED Medications  sodium chloride flush (NS) 0.9 % injection 3 mL (has no administration in time range)     Initial Impression / Assessment and Plan / ED Course  I have reviewed the triage vital signs and the nursing notes.  Pertinent lab results that were available during my care of the patient were reviewed by me and considered in my medical decision making (see chart for details).  Epigastric pain with vomiting and diarrhea, etiology unclear.  Nausea and diarrhea have resolved.  Labs are reassuring.  I have offered to give the patient some medication, but she states that she is anxious to leave.  She is declining nausea medication.  I suspect that her abdominal pain is related to GERD or ulcers.  Possible viral gastroenteritis, possible food poisoning.  No findings to suggest biliary tract disease.  With history of functional diarrhea, consider irritable bowel syndrome.  She is advised to use over-the-counter omeprazole and to use over-the-counter loperamide as needed for diarrhea.  Return precautions discussed.  Old records are reviewed, and she does have prior ED visits for abdominal pain.  Final Clinical Impressions(s) / ED Diagnoses   Final diagnoses:  Epigastric pain  Nausea vomiting and diarrhea    ED Discharge Orders    None       Delora Fuel, MD 44/81/85 [id number]

## 2019-04-15 DIAGNOSIS — R140 Abdominal distension (gaseous): Secondary | ICD-10-CM | POA: Diagnosis not present

## 2019-04-15 DIAGNOSIS — R109 Unspecified abdominal pain: Secondary | ICD-10-CM | POA: Diagnosis not present

## 2019-04-15 DIAGNOSIS — K5904 Chronic idiopathic constipation: Secondary | ICD-10-CM | POA: Diagnosis not present

## 2019-04-18 ENCOUNTER — Other Ambulatory Visit: Payer: Self-pay | Admitting: Gastroenterology

## 2019-04-18 ENCOUNTER — Other Ambulatory Visit: Payer: Self-pay

## 2019-04-18 ENCOUNTER — Ambulatory Visit
Admission: RE | Admit: 2019-04-18 | Discharge: 2019-04-18 | Disposition: A | Discharge status: Home / Self Care | Payer: BC Managed Care – PPO | Source: Ambulatory Visit | Attending: Gastroenterology | Admitting: Gastroenterology

## 2019-04-18 DIAGNOSIS — R1033 Periumbilical pain: Secondary | ICD-10-CM

## 2019-04-18 DIAGNOSIS — R1013 Epigastric pain: Secondary | ICD-10-CM | POA: Diagnosis not present

## 2019-04-18 MED ORDER — IOPAMIDOL (ISOVUE-300) INJECTION 61%
100.0000 mL | Freq: Once | INTRAVENOUS | Status: AC | PRN
  Administered 2019-04-18: 100 mL via INTRAVENOUS

## 2019-06-20 ENCOUNTER — Telehealth: Payer: Self-pay | Admitting: Internal Medicine

## 2019-06-20 NOTE — Telephone Encounter (Signed)
Put referral in for Dr Earlean Shawl and called patient to let her know.

## 2019-06-20 NOTE — Telephone Encounter (Signed)
Please make referral to Dr. Earlean Shawl

## 2019-06-20 NOTE — Telephone Encounter (Signed)
Claire Adams [phone]  Andie called to say she is still having GI issues, Bloating, severe abdominal pain, diarrhea / Constipation. She would like a virtual visit and a referral to Dr Angela Nevin, because she feels like Dr Collene Mares just blew her off.

## 2019-07-16 DIAGNOSIS — R140 Abdominal distension (gaseous): Secondary | ICD-10-CM | POA: Diagnosis not present

## 2019-07-16 DIAGNOSIS — Z791 Long term (current) use of non-steroidal anti-inflammatories (NSAID): Secondary | ICD-10-CM | POA: Diagnosis not present

## 2019-07-16 DIAGNOSIS — R109 Unspecified abdominal pain: Secondary | ICD-10-CM | POA: Diagnosis not present

## 2019-07-16 DIAGNOSIS — R112 Nausea with vomiting, unspecified: Secondary | ICD-10-CM | POA: Diagnosis not present

## 2019-07-16 DIAGNOSIS — R12 Heartburn: Secondary | ICD-10-CM | POA: Diagnosis not present

## 2019-07-24 DIAGNOSIS — N83201 Unspecified ovarian cyst, right side: Secondary | ICD-10-CM | POA: Diagnosis not present

## 2019-07-24 DIAGNOSIS — R102 Pelvic and perineal pain: Secondary | ICD-10-CM | POA: Diagnosis not present

## 2019-08-01 ENCOUNTER — Ambulatory Visit: Payer: BC Managed Care – PPO | Admitting: Internal Medicine

## 2019-08-01 ENCOUNTER — Encounter: Payer: Self-pay | Admitting: Internal Medicine

## 2019-08-01 ENCOUNTER — Telehealth: Payer: Self-pay | Admitting: Internal Medicine

## 2019-08-01 ENCOUNTER — Other Ambulatory Visit: Payer: Self-pay

## 2019-08-01 VITALS — BP 120/80 | HR 83 | Temp 98.0°F | Ht 68.25 in | Wt 123.0 lb

## 2019-08-01 DIAGNOSIS — F411 Generalized anxiety disorder: Secondary | ICD-10-CM | POA: Diagnosis not present

## 2019-08-01 DIAGNOSIS — F410 Panic disorder [episodic paroxysmal anxiety] without agoraphobia: Secondary | ICD-10-CM | POA: Diagnosis not present

## 2019-08-01 MED ORDER — CLONAZEPAM 0.5 MG PO TABS: ORAL_TABLET | ORAL | 0 refills | Status: DC

## 2019-08-01 MED ORDER — SERTRALINE HCL 50 MG PO TABS: 50.0000 mg | ORAL_TABLET | Freq: Every day | ORAL | 3 refills | Status: DC

## 2019-08-01 NOTE — Telephone Encounter (Signed)
Claire Adams [phone]   Andie called to say she would like to see you, she is having some anxiety, with school and in general. Virtual meeting would be fine or in office which ever you prefer.

## 2019-08-01 NOTE — Telephone Encounter (Signed)
Office visit

## 2019-08-01 NOTE — Telephone Encounter (Signed)
Scheduled ov 

## 2019-08-01 NOTE — Telephone Encounter (Signed)
LVM to CB and schedule office visit 

## 2019-08-09 ENCOUNTER — Encounter: Payer: Self-pay | Admitting: Internal Medicine

## 2019-08-09 NOTE — Progress Notes (Signed)
   Subjective:    Patient ID: Claire Adams, female    DOB: 1991/07/16, 28 y.o.   MRN: [id number]  HPI 28 year old Female with longstanding history of anxiety issues.  Is now doing Scientist, product/process development.  Has issues with anxiety not so much with her performance with teaching but other issues.  Denies being worried about COVID-19 pandemic and exposure at school.  She is engaged to be married.  She seems happy.  Ellene Route accompanies her today.  Issues with anxiety go back to childhood.  She does not want to go to counseling right now.  She would like to take some medication and see if symptoms improve.  Issues with panic attacks/panic disorder  Review of Systems history of chronic constipation     Objective:   Physical Exam Blood pressure 120/80 pulse 83 temperature 98 degrees BMI 18.51 weight 123 pounds She seems slightly anxious but her affect thought process and judgment appear to be normal.      Assessment & Plan:  Anxiety disorder/panic disorder  Plan: She will start Klonopin 0.5 mg at onset of panic disorder not to exceed 2 tablets daily.  She will start one half of a 50 mg tablet daily for 5 days and increasing to 50 mg daily.  Follow-up here in 4 weeks.

## 2019-08-09 NOTE — Patient Instructions (Signed)
Klonopin 0.5 mg up to twice daily if needed for panic attack.  Zoloft one half of a 50 milligrams tablet daily for 5 days increasing to 50 mg daily.  Follow-up here in 4 weeks.

## 2019-08-15 DIAGNOSIS — K219 Gastro-esophageal reflux disease without esophagitis: Secondary | ICD-10-CM | POA: Diagnosis not present

## 2019-08-24 ENCOUNTER — Other Ambulatory Visit: Payer: Self-pay | Admitting: Internal Medicine

## 2019-09-09 ENCOUNTER — Encounter: Payer: Self-pay | Admitting: Internal Medicine

## 2019-09-09 ENCOUNTER — Ambulatory Visit (INDEPENDENT_AMBULATORY_CARE_PROVIDER_SITE_OTHER): Payer: BC Managed Care – PPO | Admitting: Internal Medicine

## 2019-09-09 ENCOUNTER — Other Ambulatory Visit: Payer: Self-pay

## 2019-09-09 VITALS — BP 110/70 | HR 67 | Temp 98.6°F | Ht 68.25 in | Wt 139.0 lb

## 2019-09-09 DIAGNOSIS — K5904 Chronic idiopathic constipation: Secondary | ICD-10-CM | POA: Diagnosis not present

## 2019-09-09 DIAGNOSIS — F419 Anxiety disorder, unspecified: Secondary | ICD-10-CM

## 2019-09-09 MED ORDER — SERTRALINE HCL 100 MG PO TABS: 100.0000 mg | ORAL_TABLET | Freq: Every day | ORAL | 1 refills | Status: DC

## 2019-09-09 NOTE — Patient Instructions (Addendum)
Increase Zoloft 100 mg daily and use Klonopin sparingly as needed.  Return in 6 months or as needed. Last CPE Oct 28, 2018.

## 2019-09-09 NOTE — Progress Notes (Signed)
   Subjective:    Patient ID: Claire Adams, female    DOB: 1991/09/12, 28 y.o.   MRN: [id number]  HPI 28 year old Female seen today for follow-up of anxiety disorder. At last visit, was started on Zoloft 50 mg daily and prn Klonopin 0.05 mg up to  twice daily prn.  Says mother has similar issues.  Explained to her it is likely this was an inherited condition.  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 Accompanied by her fiance'. Feeling much better on Zoloft 50 mg daily. Only a couple of episodes of anxiety/panic.    Review of Systems see above-history of constipation- see extensive work up per Dr. Earlean Shawl. Symptoms have improved and she is off . Celiac panel negative and was given PPI for GERD     Objective:   Physical Exam Vital signs reviewed.  She looks great.  Showing no symptoms of anxiety today whatsoever.  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       Assessment & Plan:  Anxiety markedly improved on Zoloft 50 mg daily and as needed Klonopin.Has not really had to use much Klonopin at all.  History of constipation- improved and not requiring medication. Dr. Earlean Shawl thought pelvic adhesions from surgery could be the cause. He felt no treatment needed as symptoms had improved.  Plan:  Increase Zoloft to 100 mg daily to see if that will further improve anxiety.  Return as needed.  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 CPE last done Jan 2020. May return in 6 months or prn.

## 2019-12-13 ENCOUNTER — Ambulatory Visit: Payer: Self-pay | Attending: Internal Medicine

## 2019-12-13 DIAGNOSIS — Z23 Encounter for immunization: Secondary | ICD-10-CM | POA: Insufficient documentation

## 2019-12-13 NOTE — Progress Notes (Signed)
   Covid-19 Vaccination Clinic  Name:  Claire Adams    MRN: [id number] DOB: 03-02-1991  12/13/2019  [name] was observed post Covid-19 immunization for 15 minutes without incident. She was provided with Vaccine Information Sheet and instruction to access the V-Safe system.   [name] was instructed to call 911 with any severe reactions post vaccine: Marland Kitchen Difficulty breathing  . Swelling of face and throat  . A fast heartbeat  . A bad rash all over body  . Dizziness and weakness   Immunizations Administered    Name Date Dose VIS Date Route   Pfizer COVID-19 Vaccine 12/13/2019  8:19 AM 0.3 mL 09/19/2019 Intramuscular   Manufacturer: [employer], [employer]   Lot: [id number]   NDC: [address]-6

## 2020-01-03 ENCOUNTER — Ambulatory Visit: Payer: Self-pay | Attending: Internal Medicine

## 2020-01-03 DIAGNOSIS — Z23 Encounter for immunization: Secondary | ICD-10-CM

## 2020-01-03 NOTE — Progress Notes (Signed)
   Covid-19 Vaccination Clinic  Name:  DERIKA ECKLES    MRN: [id number] DOB: 10-Jul-1991  01/03/2020  [name] was observed post Covid-19 immunization for 15 minutes without incident. She was provided with Vaccine Information Sheet and instruction to access the V-Safe system.   [name] was instructed to call 911 with any severe reactions post vaccine: Marland Kitchen Difficulty breathing  . Swelling of face and throat  . A fast heartbeat  . A bad rash all over body  . Dizziness and weakness   Immunizations Administered    Name Date Dose VIS Date Route   Pfizer COVID-19 Vaccine 01/03/2020  8:23 AM 0.3 mL 09/19/2019 Intramuscular   Manufacturer: [employer], [employer]   Lot: [id number]   NDC: [address]-0

## 2020-01-26 ENCOUNTER — Telehealth: Payer: Self-pay

## 2020-01-26 NOTE — Telephone Encounter (Signed)
Patient has been having stomach pain all day yesterday and this morning.  Pain is in the Upper middle of the stomach.   She is thinking that she has a stomach ulcer?   Would like you opinion on what she should do.  Please review and advise.  Thank you

## 2020-01-26 NOTE — Telephone Encounter (Signed)
Pt was notified of instructions, pt verbalized understanding.   

## 2020-01-26 NOTE — Telephone Encounter (Signed)
Please have her call Dr. Kinnie Scales her gastroenterologist

## 2020-02-03 DIAGNOSIS — R1013 Epigastric pain: Secondary | ICD-10-CM | POA: Diagnosis not present

## 2020-02-04 DIAGNOSIS — R1013 Epigastric pain: Secondary | ICD-10-CM | POA: Diagnosis not present

## 2020-02-16 DIAGNOSIS — R1013 Epigastric pain: Secondary | ICD-10-CM | POA: Diagnosis not present

## 2020-02-16 DIAGNOSIS — K219 Gastro-esophageal reflux disease without esophagitis: Secondary | ICD-10-CM | POA: Diagnosis not present

## 2020-02-20 ENCOUNTER — Telehealth: Payer: Self-pay | Admitting: Internal Medicine

## 2020-02-20 NOTE — Telephone Encounter (Signed)
Wednesday at 10 am

## 2020-02-20 NOTE — Telephone Encounter (Signed)
Pt wants to schedule an appt to discuss anxiety medication for next week, when would you want to see her?

## 2020-02-20 NOTE — Telephone Encounter (Signed)
Scheduled

## 2020-02-25 ENCOUNTER — Encounter: Payer: Self-pay | Admitting: Internal Medicine

## 2020-02-25 ENCOUNTER — Other Ambulatory Visit: Payer: Self-pay

## 2020-02-25 ENCOUNTER — Ambulatory Visit (INDEPENDENT_AMBULATORY_CARE_PROVIDER_SITE_OTHER): Payer: BC Managed Care – PPO | Admitting: Internal Medicine

## 2020-02-25 VITALS — BP 100/80 | HR 80 | Ht 68.25 in | Wt 138.0 lb

## 2020-02-25 DIAGNOSIS — Z8719 Personal history of other diseases of the digestive system: Secondary | ICD-10-CM | POA: Diagnosis not present

## 2020-02-25 DIAGNOSIS — F411 Generalized anxiety disorder: Secondary | ICD-10-CM | POA: Diagnosis not present

## 2020-02-25 DIAGNOSIS — F439 Reaction to severe stress, unspecified: Secondary | ICD-10-CM | POA: Diagnosis not present

## 2020-02-25 DIAGNOSIS — Z8659 Personal history of other mental and behavioral disorders: Secondary | ICD-10-CM | POA: Diagnosis not present

## 2020-02-25 MED ORDER — CLONAZEPAM 0.5 MG PO TABS: 0.5000 mg | ORAL_TABLET | Freq: Three times a day (TID) | ORAL | 1 refills | Status: DC | PRN

## 2020-02-25 NOTE — Patient Instructions (Signed)
Take Klonopin twice daily and may take third dose middle of the day if needed.  Continue Zoloft 100 mg daily.  Please contact the numbers I gave you for psychological counseling and medication consultation.

## 2020-02-28 NOTE — Progress Notes (Signed)
   Subjective:    Patient ID: Claire Adams, female    DOB: 12/16/1990, 29 y.o.   MRN: [id number]  HPI 29 year old Female who is being married in 10 days.  She has a history of anxiety.  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  Has been treated with antianxiety medication consisting of Zoloft 100 mg daily and Klonopin.  As with many weddings, there has been some stress.  She had asked 1 bridesmaid not to be in the wedding.  She feels that she and her fianc have not been communicating as well recently.  There has been some issues with whether cast should wear a masks or not.  She is concerned about food at the reception.  Patient does not smoke.  Usually has 1 alcoholic drink a week.  Graduated [employer].  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  Review of Systems see above-history of constipation.  Has had 2 COVID-19 vaccines.     Objective:   Physical Exam Blood pressure 100/80 pulse 80, pulse oximetry 97% weight 138 pounds  I spent 30 minutes speaking with her about anxiety, treatment options, need for counseling as anxiety continues to resurface from time to time despite being on medication.  Explained I am not comfortable increasing Zoloft more than 100 mg daily.  She may need psychiatric med consult.  Have suggested [facility] for med consultation and counseling.  In the meantime, I am increasing Klonopin to 0.5 mg at least twice and up to 3 times daily.  I think temporarily this will help with situational stress with upcoming wedding and issues surrounding that.  Explained to her that it was not unusual to have anxiety around the time of her wedding with all the preparations needed, wish for perfection, and pressure that brides often feel.      Assessment & Plan:  Anxiety state due to upcoming wedding   History of anxiety disorder  History of constipation-may worsen with increased dose of Klonopin  Plan: Zoloft 100 mg daily.  Klonopin 0.5 mg  at least twice daily and up to 3 times daily as needed for anxiety.  Recommend med consult with [facility] and counseling through them as well.  30 minutes spent with patient

## 2020-03-01 ENCOUNTER — Other Ambulatory Visit: Payer: Self-pay | Admitting: Internal Medicine

## 2020-03-25 ENCOUNTER — Other Ambulatory Visit: Payer: Self-pay

## 2020-06-19 ENCOUNTER — Other Ambulatory Visit: Payer: Self-pay

## 2020-06-19 ENCOUNTER — Encounter (HOSPITAL_COMMUNITY): Payer: Self-pay | Admitting: Emergency Medicine

## 2020-06-19 ENCOUNTER — Inpatient Hospital Stay (HOSPITAL_COMMUNITY)
Admission: AD | Admit: 2020-06-19 | Discharge: 2020-06-25 | DRG: 885 | Disposition: A | Discharge status: Home / Self Care | Payer: BC Managed Care – PPO | Source: Intra-hospital | Attending: Psychiatry | Admitting: Psychiatry

## 2020-06-19 ENCOUNTER — Emergency Department (HOSPITAL_COMMUNITY)
Admission: EM | Admit: 2020-06-19 | Discharge: 2020-06-19 | Disposition: A | Discharge status: Home / Self Care | Payer: BC Managed Care – PPO | Source: Home / Self Care | Attending: Emergency Medicine | Admitting: Emergency Medicine

## 2020-06-19 DIAGNOSIS — F331 Major depressive disorder, recurrent, moderate: Secondary | ICD-10-CM | POA: Diagnosis not present

## 2020-06-19 DIAGNOSIS — F332 Major depressive disorder, recurrent severe without psychotic features: Principal | ICD-10-CM | POA: Diagnosis present

## 2020-06-19 DIAGNOSIS — R9431 Abnormal electrocardiogram [ECG] [EKG]: Secondary | ICD-10-CM | POA: Diagnosis not present

## 2020-06-19 DIAGNOSIS — Z8249 Family history of ischemic heart disease and other diseases of the circulatory system: Secondary | ICD-10-CM

## 2020-06-19 DIAGNOSIS — Y92239 Unspecified place in hospital as the place of occurrence of the external cause: Secondary | ICD-10-CM | POA: Diagnosis present

## 2020-06-19 DIAGNOSIS — Z79899 Other long term (current) drug therapy: Secondary | ICD-10-CM

## 2020-06-19 DIAGNOSIS — Y929 Unspecified place or not applicable: Secondary | ICD-10-CM | POA: Insufficient documentation

## 2020-06-19 DIAGNOSIS — X58XXXA Exposure to other specified factors, initial encounter: Secondary | ICD-10-CM | POA: Insufficient documentation

## 2020-06-19 DIAGNOSIS — Z823 Family history of stroke: Secondary | ICD-10-CM | POA: Diagnosis not present

## 2020-06-19 DIAGNOSIS — K5900 Constipation, unspecified: Secondary | ICD-10-CM | POA: Diagnosis present

## 2020-06-19 DIAGNOSIS — Z811 Family history of alcohol abuse and dependence: Secondary | ICD-10-CM | POA: Diagnosis not present

## 2020-06-19 DIAGNOSIS — Z8261 Family history of arthritis: Secondary | ICD-10-CM | POA: Diagnosis not present

## 2020-06-19 DIAGNOSIS — Z20822 Contact with and (suspected) exposure to covid-19: Secondary | ICD-10-CM | POA: Diagnosis not present

## 2020-06-19 DIAGNOSIS — F411 Generalized anxiety disorder: Secondary | ICD-10-CM | POA: Diagnosis not present

## 2020-06-19 DIAGNOSIS — Y999 Unspecified external cause status: Secondary | ICD-10-CM | POA: Insufficient documentation

## 2020-06-19 DIAGNOSIS — G4700 Insomnia, unspecified: Secondary | ICD-10-CM | POA: Diagnosis not present

## 2020-06-19 DIAGNOSIS — F419 Anxiety disorder, unspecified: Secondary | ICD-10-CM

## 2020-06-19 DIAGNOSIS — Y939 Activity, unspecified: Secondary | ICD-10-CM | POA: Insufficient documentation

## 2020-06-19 DIAGNOSIS — T424X2A Poisoning by benzodiazepines, intentional self-harm, initial encounter: Secondary | ICD-10-CM | POA: Diagnosis present

## 2020-06-19 DIAGNOSIS — T1491XA Suicide attempt, initial encounter: Secondary | ICD-10-CM | POA: Insufficient documentation

## 2020-06-19 LAB — CBC WITH DIFFERENTIAL/PLATELET
Abs Immature Granulocytes: 0.03 10*3/uL (ref 0.00–0.07)
Basophils Absolute: 0 10*3/uL (ref 0.0–0.1)
Basophils Relative: 0 %
Eosinophils Absolute: 0 10*3/uL (ref 0.0–0.5)
Eosinophils Relative: 0 %
HCT: 40.4 % (ref 36.0–46.0)
Hemoglobin: 13.9 g/dL (ref 12.0–15.0)
Immature Granulocytes: 0 %
Lymphocytes Relative: 20 %
Lymphs Abs: 1.5 10*3/uL (ref 0.7–4.0)
MCH: 33.9 pg (ref 26.0–34.0)
MCHC: 34.4 g/dL (ref 30.0–36.0)
MCV: 98.5 fL (ref 80.0–100.0)
Monocytes Absolute: 0.5 10*3/uL (ref 0.1–1.0)
Monocytes Relative: 7 %
Neutro Abs: 5.5 10*3/uL (ref 1.7–7.7)
Neutrophils Relative %: 73 %
Platelets: 251 10*3/uL (ref 150–400)
RBC: 4.1 MIL/uL (ref 3.87–5.11)
RDW: 12.1 % (ref 11.5–15.5)
WBC: 7.6 10*3/uL (ref 4.0–10.5)
nRBC: 0 % (ref 0.0–0.2)

## 2020-06-19 LAB — ACETAMINOPHEN LEVEL
Acetaminophen (Tylenol), Serum: <10 ug/mL — ABNORMAL LOW (ref 10–30)
Acetaminophen (Tylenol), Serum: <10 ug/mL — ABNORMAL LOW (ref 10–30)

## 2020-06-19 LAB — COMPREHENSIVE METABOLIC PANEL
ALT: 24 U/L (ref 0–44)
AST: 19 U/L (ref 15–41)
Albumin: 3.9 g/dL (ref 3.5–5.0)
Alkaline Phosphatase: 38 U/L (ref 38–126)
Anion gap: 7 (ref 5–15)
BUN: 14 mg/dL (ref 6–20)
CO2: 26 mmol/L (ref 22–32)
Calcium: 9.1 mg/dL (ref 8.9–10.3)
Chloride: 105 mmol/L (ref 98–111)
Creatinine, Ser: 0.52 mg/dL (ref 0.44–1.00)
GFR calc Af Amer: >60 mL/min (ref >60)
GFR calc non Af Amer: >60 mL/min (ref >60)
Glucose, Bld: 98 mg/dL (ref 70–99)
Potassium: 3.8 mmol/L (ref 3.5–5.1)
Sodium: 138 mmol/L (ref 135–145)
Total Bilirubin: 0.4 mg/dL (ref 0.3–1.2)
Total Protein: 5.8 g/dL — ABNORMAL LOW (ref 6.5–8.1)

## 2020-06-19 LAB — I-STAT BETA HCG BLOOD, ED (MC, WL, AP ONLY): I-stat hCG, quantitative: <5 m[IU]/mL (ref <5)

## 2020-06-19 LAB — ETHANOL: Alcohol, Ethyl (B): <10 mg/dL (ref <10)

## 2020-06-19 LAB — RAPID URINE DRUG SCREEN, HOSP PERFORMED
Amphetamines: NOT DETECTED
Barbiturates: NOT DETECTED
Benzodiazepines: POSITIVE — AB
Cocaine: NOT DETECTED
Opiates: NOT DETECTED
Tetrahydrocannabinol: NOT DETECTED

## 2020-06-19 LAB — SARS CORONAVIRUS 2 BY RT PCR (HOSPITAL ORDER, PERFORMED IN ~~LOC~~ HOSPITAL LAB): SARS Coronavirus 2: NEGATIVE

## 2020-06-19 LAB — SALICYLATE LEVEL: Salicylate Lvl: <7 mg/dL — ABNORMAL LOW (ref 7.0–30.0)

## 2020-06-19 MED ORDER — ACETAMINOPHEN 325 MG PO TABS: 650.0000 mg | ORAL_TABLET | ORAL | Status: DC | PRN

## 2020-06-19 MED ORDER — SODIUM CHLORIDE 0.9 % IV BOLUS
500.0000 mL | Freq: Once | INTRAVENOUS | Status: AC
  Administered 2020-06-19: 500 mL via INTRAVENOUS

## 2020-06-19 MED ORDER — SODIUM CHLORIDE 0.9 % IV BOLUS: 1000.0000 mL | Freq: Once | INTRAVENOUS | Status: DC

## 2020-06-19 NOTE — ED Notes (Addendum)
Admits to attempting to harm herself, contracted for safety at this time. Patient is getting dressed into a gown and belongings are to be secured. Clonazepam at nurse's station.

## 2020-06-19 NOTE — ED Notes (Signed)
Pt belongings labeled and secured at nurses station

## 2020-06-19 NOTE — ED Notes (Signed)
Pt currently undergoing TTS eval.  

## 2020-06-19 NOTE — ED Notes (Signed)
Safe transport here for pt transfer to [facility].

## 2020-06-19 NOTE — ED Notes (Signed)
Report called for pt transfer to [facility]
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
## 2020-06-19 NOTE — ED Triage Notes (Addendum)
Patient here from home reporting drug overdose on Klonopin 0.5mg  "2 handfuls" today 1 hour ago. Suicidal. "a lot of life things going on". 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

## 2020-06-19 NOTE — ED Provider Notes (Signed)
[facility]-EMERGENCY DEPT Provider Note   CSN: [id number] Arrival date & time: 06/19/20  1500     History Chief Complaint  Patient presents with  . Drug Overdose  . Suicidal    Claire Adams is a 29 y.o. female with past medical history of anxiety, who presents today for evaluation of a suicide attempt.  About 1 hour prior to arrival she took 2 handfuls of Klonopin.  She states this with her prescription Klonopin which is 0.5 mg tablets.  She states that she felt like things would be better off without her.  After she took the handfuls she then texted her parents and siblings a goodbye text.  They called her, and her husband who all came to the house and brought her here.    She states currently she does not regret taking the Klonopin and still believes things would be better if she was dead.  She does not have a history of prior suicide attempts.  She has seen her primary care doctor who gave her the Klonopin, however does not have a counselor/therapist or a psychiatrist.  She denies any recent sickness or illness.  She is vaccinated against Covid.  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  Reports additional stressors including anxiety her entire life, mostly related to school, from third grade on.  Per [name] patients husband reports that she has anxiety and depression building for years.    HPI     Past Medical History:  Diagnosis Date  . Broken arm 2017   Right elbow per pt  . 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 Ovarian cyst     Patient Active Problem List   Diagnosis Date Noted  . Suicide attempt by benzodiazepine overdose (HCC) 06/19/2020  . History of migraine headaches 04/06/2019  . 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 Anxiety 11/03/2018    Past Surgical History:  Procedure Laterality Date  . bilateral ovarian cystectomy Bilateral 2019  . broke left  arm Left 2006  . broke right arm  2018  . LAPAROSCOPIC OVARIAN CYSTECTOMY Bilateral   . severe ligament tear right foot    . WISDOM TOOTH EXTRACTION       OB History    Gravida  0   Para  0   Term  0   Preterm  0   AB  0   Living  0     SAB  0   TAB  0   Ectopic  0   Multiple  0   Live Births  0           Family History  Problem Relation Age of Onset  . Healthy Mother   . Arthritis Mother   . Healthy Father   . Hypertension Father   . Arthritis Maternal Grandmother   . Stroke Paternal Grandmother     Social History   Tobacco Use  . Smoking status: Never Smoker  . Smokeless tobacco: Never Used  Vaping Use  . Vaping Use: Never used  Substance Use Topics  . Alcohol use: Yes  . Drug use: Never    Home Medications Prior to Admission medications   Medication Sig Start Date End Date Taking? Authorizing Provider  clonazePAM (KLONOPIN) 0.5 MG tablet Take 1 tablet (0.5 mg total) by mouth 3 (three) times daily as needed for anxiety. 02/25/20  Yes Baxley,  Luanna Cole, MD  INCASSIA 0.35 MG tablet Take 1 tablet by mouth daily. 06/21/19  Yes [provider]  sertraline (ZOLOFT) 100 MG tablet TAKE 1 TABLET BY MOUTH EVERY DAY 03/01/20  Yes Baxley, Luanna Cole, MD    Allergies    Patient has no known allergies.  Review of Systems   Review of Systems  Constitutional: Negative for chills and fever.  HENT: Negative for congestion.   Eyes: Negative for visual disturbance.  Respiratory: Negative for cough and shortness of breath.   Cardiovascular: Negative for chest pain.  Gastrointestinal: Negative for abdominal distention.  Genitourinary: Negative for dysuria.  Musculoskeletal: Negative for back pain and neck pain.  Skin: Negative for color change and rash.  Neurological: Negative for weakness and headaches.  Psychiatric/Behavioral: Positive for behavioral problems and suicidal ideas. Negative for dysphoric mood. The patient is nervous/anxious.        Reports  feeling tired.   All other systems reviewed and are negative.   Physical Exam Updated Vital Signs BP 100/67   Pulse 72   Temp 97.7 F (36.5 C) (Oral)   Resp 19   SpO2 98%   Physical Exam Vitals and nursing note reviewed.  Constitutional:      General: She is not in acute distress.    Appearance: She is well-developed. She is not diaphoretic.     Comments: Patient appears tired.  She greets me when I walk in the room.  She is able to answer questions, does not require verbal or painful stimulation to stay awake to answer questions.   HENT:     Head: Normocephalic and atraumatic.  Eyes:     General: No scleral icterus.       Right eye: No discharge.        Left eye: No discharge.     Conjunctiva/sclera: Conjunctivae normal.  Cardiovascular:     Rate and Rhythm: Normal rate and regular rhythm.     Pulses: Normal pulses.     Heart sounds: Normal heart sounds.  Pulmonary:     Effort: Pulmonary effort is normal. No respiratory distress.     Breath sounds: No stridor.  Abdominal:     General: There is no distension.     Tenderness: There is no abdominal tenderness.  Musculoskeletal:        General: No deformity.     Cervical back: Normal range of motion and neck supple.  Skin:    General: Skin is warm and dry.  Neurological:     Mental Status: She is alert and oriented to person, place, and time. Mental status is at baseline.     Motor: No abnormal muscle tone.  Psychiatric:        Mood and Affect: Affect is blunt and flat.        Speech: Speech normal.        Behavior: Behavior is cooperative.        Thought Content: Thought content is not paranoid or delusional. Thought content includes suicidal ideation. Thought content does not include homicidal ideation. Thought content includes suicidal plan. Thought content does not include homicidal plan.        Judgment: Judgment is impulsive.     Comments: Does not appear to be responding to internal stimuli     ED Results /  Procedures / Treatments   Labs (all labs ordered are listed, but only abnormal results are displayed) Labs Reviewed  COMPREHENSIVE METABOLIC PANEL - Abnormal; Notable for the following components:  Result Value   Total Protein 5.8 (*)    All other components within normal limits  RAPID URINE DRUG SCREEN, HOSP PERFORMED - Abnormal; Notable for the following components:   Benzodiazepines POSITIVE (*)    All other components within normal limits  ACETAMINOPHEN LEVEL - Abnormal; Notable for the following components:   Acetaminophen (Tylenol), Serum <10 (*)    All other components within normal limits  SALICYLATE LEVEL - Abnormal; Notable for the following components:   Salicylate Lvl <7.0 (*)    All other components within normal limits  ACETAMINOPHEN LEVEL - Abnormal; Notable for the following components:   Acetaminophen (Tylenol), Serum <10 (*)    All other components within normal limits  SARS CORONAVIRUS 2 BY RT PCR (HOSPITAL ORDER, PERFORMED IN [facility])  ETHANOL  CBC WITH DIFFERENTIAL/PLATELET  I-STAT BETA HCG BLOOD, ED ([facility], [facility], [facility] ONLY)    EKG EKG Interpretation  Date/Time:  Saturday June 19 2020 16:14:00 EDT Ventricular Rate:  63 PR Interval:    QRS Duration: 89 QT Interval:  418 QTC Calculation: 428 R Axis:   92 Text Interpretation: Sinus rhythm Borderline right axis deviation RSR' in V1 or V2, probably normal variant No old tracing to compare Confirmed by Linwood Dibbles [phone]) on 06/19/2020 4:16:12 PM   Radiology No results found.  Procedures Procedures (including critical care time)  Medications Ordered in ED Medications  acetaminophen (TYLENOL) tablet 650 mg (has no administration in time range)  sodium chloride 0.9 % bolus 500 mL (500 mLs Intravenous New Bag/Given 06/19/20 1903)    ED Course  I have reviewed the triage vital signs and the nursing notes.  Pertinent labs & imaging results that were available during my care of the  patient were reviewed by me and considered in my medical decision making (see chart for details).  Clinical Course as of Jun 19 2140  Sat Jun 19, 2020  1553 Angelique Blonder with poison control reports 6 hour observation, cardiac monitoring, 4 hour tylenol level.    [EH]  1811 IV fluids ordered, patient re-evaluated, I checked BP and is in 100s systolic.  Will give fluid bolus.  Patient remains sleepy but talkative.   BP(!): 95/57 [EH]  1850 I spoke with Patients RN.  Her husband is given an update with patients permission.  He wants to come and see her.  Patient wishes for him to come in.  RN is in agreement until patient is medically cleared.    [EH]  2010 BP improved with fluids  BP: 111/64 [EH]  2114 4 hour tylenol is normal  Acetaminophen level(!) [EH]  2114 On review of previous visits it appears patient BP tends to run around 100 systolic, this appears consistent with her baseline  BP(!): 95/54 [EH]    Clinical Course User Index [EH] Norman Clay   MDM Rules/Calculators/A&P                         Patient is a 29 year old woman who presents today for evaluation after an intentional benzodiazepine overdose.  About an hour prior to arrival she took 2 handfuls of her clonazepam in an attempt to end her life.  Chart review shows that patient's blood pressures normally run soft, often her systolic is right at 100.  This is consistent with her blood pressures here.  Poison control was contacted, they recommended a 4-hour Tylenol level and observation for 6 hours.  Medical clearance labs obtained,  CBC and CMP are unremarkable.  Ethanol is not elevated, salicylate level is negative.  Acetaminophen initial and then at 4 hours are both under 10.  Pregnancy test is negative.  EKG without prolonged QT interval.  She was given 500 cc of IV fluids.    After over 6 hours patient has remained stable in the emergency room.  She remains slightly drowsy however is more interactive, able  to answer questions without difficulty.  No indication for medical admission at this time.  Patient is medically clear for psychiatric evaluation and disposition.  If patient attempts to leave prior to TTS evaluation would recommend IVC.   Note: Portions of this report may have been transcribed using voice recognition software. Every effort was made to ensure accuracy; however, inadvertent computerized transcription errors may be present   Final Clinical Impression(s) / ED Diagnoses Final diagnoses:  Suicide attempt by benzodiazepine overdose [facility](HCC)    Rx / DC Orders ED Discharge Orders    None       Norman ClayHammond, Envi Eagleson W, PA-C 06/19/20 2141    Linwood DibblesKnapp, Jon, MD 06/20/20 1500

## 2020-06-20 ENCOUNTER — Telehealth: Payer: Self-pay | Admitting: Internal Medicine

## 2020-06-20 ENCOUNTER — Encounter (HOSPITAL_COMMUNITY): Payer: Self-pay

## 2020-06-20 ENCOUNTER — Other Ambulatory Visit: Payer: Self-pay

## 2020-06-20 DIAGNOSIS — G4700 Insomnia, unspecified: Secondary | ICD-10-CM | POA: Diagnosis present

## 2020-06-20 DIAGNOSIS — F332 Major depressive disorder, recurrent severe without psychotic features: Secondary | ICD-10-CM | POA: Diagnosis present

## 2020-06-20 LAB — URINALYSIS, ROUTINE W REFLEX MICROSCOPIC
Bilirubin Urine: NEGATIVE
Glucose, UA: NEGATIVE mg/dL
Hgb urine dipstick: NEGATIVE
Ketones, ur: NEGATIVE mg/dL
Nitrite: NEGATIVE
Protein, ur: NEGATIVE mg/dL
Specific Gravity, Urine: 1.03 (ref 1.005–1.030)
pH: 6 (ref 5.0–8.0)

## 2020-06-20 MED ORDER — SERTRALINE HCL 50 MG PO TABS
50.0000 mg | ORAL_TABLET | Freq: Once | ORAL | Status: AC
  Administered 2020-06-21: 50 mg via ORAL
  Filled 2020-06-20: qty 1

## 2020-06-20 MED ORDER — SERTRALINE HCL 25 MG PO TABS
25.0000 mg | ORAL_TABLET | Freq: Once | ORAL | Status: AC
  Administered 2020-06-22: 25 mg via ORAL
  Filled 2020-06-20: qty 1

## 2020-06-20 MED ORDER — HYDROXYZINE HCL 25 MG PO TABS
25.0000 mg | ORAL_TABLET | Freq: Once | ORAL | Status: DC
  Filled 2020-06-20: qty 1

## 2020-06-20 MED ORDER — ALUM & MAG HYDROXIDE-SIMETH 200-200-20 MG/5ML PO SUSP: 30.0000 mL | ORAL | Status: DC | PRN

## 2020-06-20 MED ORDER — RISPERIDONE 1 MG PO TABS
1.0000 mg | ORAL_TABLET | Freq: Every day | ORAL | Status: DC
  Filled 2020-06-20 (×3): qty 1

## 2020-06-20 MED ORDER — DULOXETINE HCL 30 MG PO CPEP
30.0000 mg | ORAL_CAPSULE | Freq: Every day | ORAL | Status: DC
  Administered 2020-06-20 – 2020-06-21 (×2): 30 mg via ORAL
  Filled 2020-06-20 (×4): qty 1

## 2020-06-20 MED ORDER — ACETAMINOPHEN 325 MG PO TABS: 650.0000 mg | ORAL_TABLET | Freq: Four times a day (QID) | ORAL | Status: DC | PRN

## 2020-06-20 MED ORDER — SERTRALINE HCL 100 MG PO TABS
100.0000 mg | ORAL_TABLET | Freq: Every day | ORAL | Status: DC
  Administered 2020-06-20: 100 mg via ORAL
  Filled 2020-06-20 (×4): qty 1

## 2020-06-20 MED ORDER — HYDROXYZINE HCL 25 MG PO TABS: 25.0000 mg | ORAL_TABLET | Freq: Three times a day (TID) | ORAL | Status: DC | PRN

## 2020-06-20 MED ORDER — HYDROXYZINE HCL 25 MG PO TABS
25.0000 mg | ORAL_TABLET | ORAL | Status: DC | PRN
  Administered 2020-06-21 – 2020-06-25 (×7): 25 mg via ORAL
  Filled 2020-06-20 (×8): qty 1

## 2020-06-20 MED ORDER — MAGNESIUM HYDROXIDE 400 MG/5ML PO SUSP: 30.0000 mL | Freq: Every day | ORAL | Status: DC | PRN

## 2020-06-20 MED ORDER — TRAZODONE HCL 50 MG PO TABS
50.0000 mg | ORAL_TABLET | Freq: Every evening | ORAL | Status: DC | PRN
  Filled 2020-06-20: qty 1

## 2020-06-20 MED ORDER — NORETHINDRONE 0.35 MG PO TABS
1.0000 | ORAL_TABLET | Freq: Every day | ORAL | Status: DC
  Administered 2020-06-22 – 2020-06-25 (×4): 0.35 mg via ORAL
  Filled 2020-06-20 (×4): qty 1

## 2020-06-20 MED ORDER — SERTRALINE HCL 50 MG PO TABS
50.0000 mg | ORAL_TABLET | Freq: Once | ORAL | Status: DC
  Filled 2020-06-20: qty 1

## 2020-06-20 MED ORDER — NICOTINE 21 MG/24HR TD PT24
21.0000 mg | MEDICATED_PATCH | Freq: Every day | TRANSDERMAL | Status: DC
  Filled 2020-06-20 (×3): qty 1
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
## 2020-06-20 NOTE — H&P (Signed)
Psychiatric Admission Assessment Adult  Patient Identification: Claire Adams  MRN:  [id number]  Date of Evaluation:  06/20/2020  Chief Complaint: Suicide attempt by overdose.  Principal Diagnosis: MDD (major depressive disorder), recurrent episode, severe (HCC)  Diagnosis:  Principal Problem:   MDD (major depressive disorder), recurrent episode, severe (HCC) Active Problems:   Suicide attempt by benzodiazepine overdose (HCC)   Major depressive disorder, recurrent episode, severe (HCC)  History of Present Illness: This is the first inpatient psychiatric admission for this 29 year old Caucasian female with prior hx of major depression & anxiety disorder. She is being admitted to the [facility] from the [facility] long hospital ED with complaint of worsening symptoms of depression & intentional suicide attempt by overdose on two handful of klonopin 0.5 mg tablets. She was apparently was taken to the ED by her parents & husband. After evaluation at the ED & medical stabilization, he was sent to the [facility] for further evaluation & treatment. During this admission evaluation, Claire Adams reports, "Yesterday around 3:00 PM, I was taken to the [facility] ED by my husband & my parents. I attempted to overdose on 2 handful of klonopin tablets. I thought it would be better if I die & not take up space any longer. I have felt like this for a long time. 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 I keep failing them. My husband has been trying to do everything whereby I was feeling useless because I could not help him. 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 But, I did not know how demanding the job could become. I could not keep up with all that I needed to do. I could not meet the needs of these students. I was not doing a good job. I felt like I'm a failure & I'm failing these kids. I have been living with anxiety &  depression most of my life. My depression & anxiety are it's worse now. I'm tired of living inside of my body. I'm no longer able to hold food because of anxiety. I keep throwing up after meals.I don't sleep at night because my mind will not stop racing. My mind does not shut off. I have been on Sertraline for a long time, but it is not working. This medicine has not helped me any. I have been on both klonopin & Sertraline x 18 months. The suicidal thoughts has been going on for many weeks now. This is the first time I have actually carried it out. I need help". During this assessment, Claire Adams presents groggy & sluggish, but oriented".  Associated Signs/Symptoms:  Depression Symptoms:  depressed mood, anhedonia, insomnia, feelings of worthlessness/guilt, difficulty concentrating, hopelessness, suicidal thoughts with specific plan, suicidal attempt, anxiety,  (Hypo) Manic Symptoms:  Impulsivity, Labiality of Mood,  Anxiety Symptoms:  Excessive Worry,  Psychotic Symptoms:  Denies  PTSD Symptoms: NA  Total Time spent with patient: 1 hour  Past Psychiatric History: Major depression, Anxiety disorder.  Is the patient at risk to self? No.  Has the patient been a risk to self in the past 6 months? Yes.    Has the patient been a risk to self within the distant past? Yes.    Is the patient a risk to others? No.  Has the patient been a risk to others in the past 6 months? No.  Has the patient been a risk  to others within the distant past? No.   Prior Inpatient Therapy: Denies  Prior Outpatient Therapy: Denies.  Alcohol Screening: 1. How often do you have a drink containing alcohol?: Monthly or less 2. How many drinks containing alcohol do you have on a typical day when you are drinking?: 1 or 2 3. How often do you have six or more drinks on one occasion?: Never AUDIT-C Score: 1 4. How often during the last year have you found that you were not able to stop drinking once you had  started?: Never 5. How often during the last year have you failed to do what was normally expected from you because of drinking?: Never 6. How often during the last year have you needed a first drink in the morning to get yourself going after a heavy drinking session?: Never 7. How often during the last year have you had a feeling of guilt of remorse after drinking?: Never 8. How often during the last year have you been unable to remember what happened the night before because you had been drinking?: Never 9. Have you or someone else been injured as a result of your drinking?: No 10. Has a relative or friend or a doctor or another health worker been concerned about your drinking or suggested you cut down?: No Alcohol Use Disorder Identification Test Final Score (AUDIT): 1 Alcohol Brief Interventions/Follow-up: AUDIT Score <7 follow-up not indicated  Substance Abuse History in the last 12 months:  No.  Consequences of Substance Abuse: NA  Previous Psychotropic Medications: Yes, Klonopin, Sertraline  Psychological Evaluations: No   Past Medical History:  Past Medical History:  Diagnosis Date  . Broken arm 2017   Right elbow per pt  . 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 Ovarian cyst     Past Surgical History:  Procedure Laterality Date  . bilateral ovarian cystectomy Bilateral 2019  . broke left arm Left 2006  . broke right arm  2018  . LAPAROSCOPIC OVARIAN CYSTECTOMY Bilateral   . severe ligament tear right foot    . WISDOM TOOTH EXTRACTION     Family History:  Family History  Problem Relation Age of Onset  . Healthy Mother   . Arthritis Mother   . Healthy Father   . Hypertension Father   . Arthritis Maternal Grandmother   . Stroke Paternal Grandmother    Family Psychiatric  History: Depression/Anxiety disorders: Mother.                                                       Completed suicide: Paternal uncle.  Tobacco Screening: Have  you used any form of tobacco in the last 30 days? (Cigarettes, Smokeless Tobacco, Cigars, and/or Pipes): No Social History:  Social History   Substance and Sexual Activity  Alcohol Use Yes     Social History   Substance and Sexual Activity  Drug Use Never    Additional Social History:  Allergies:  No Known Allergies  Lab Results:  Results for orders placed or performed during the hospital encounter of 06/19/20 (from the past 48 hour(s))  SARS Coronavirus 2 by RT PCR (hospital order, performed in [facility] hospital lab) Nasopharyngeal Nasopharyngeal Swab     Status: None   Collection Time: 06/19/20  3:51 PM  Specimen: Nasopharyngeal Swab  Result Value Ref Range   SARS Coronavirus 2 NEGATIVE NEGATIVE    Comment: (NOTE) SARS-CoV-2 target nucleic acids are NOT DETECTED.  The SARS-CoV-2 RNA is generally detectable in upper and lower respiratory specimens during the acute phase of infection. The lowest concentration of SARS-CoV-2 viral copies this assay can detect is 250 copies / mL. A negative result does not preclude SARS-CoV-2 infection and should not be used as the sole basis for treatment or other patient management decisions.  A negative result may occur with improper specimen collection / handling, submission of specimen other than nasopharyngeal swab, presence of viral mutation(s) within the areas targeted by this assay, and inadequate number of viral copies (<250 copies / mL). A negative result must be combined with clinical observations, patient history, and epidemiological information.  Fact Sheet for Patients:   BoilerBrush.com.cy  Fact Sheet for Healthcare Providers: https://pope.com/  This test is not yet approved or  cleared by the [place] FDA and has been authorized for detection and/or diagnosis of SARS-CoV-2 by FDA under an Emergency Use Authorization (EUA).  This EUA will remain in effect (meaning this  test can be used) for the duration of the COVID-19 declaration under Section 564(b)(1) of the Act, 21 U.S.C. section 360bbb-3(b)(1), unless the authorization is terminated or revoked sooner.  Performed at [facility], [address], [place], [place] [address]   Urine rapid drug screen (hosp performed)     Status: Abnormal   Collection Time: 06/19/20  3:51 PM  Result Value Ref Range   Opiates NONE DETECTED NONE DETECTED   Cocaine NONE DETECTED NONE DETECTED   Benzodiazepines POSITIVE (A) NONE DETECTED   Amphetamines NONE DETECTED NONE DETECTED   Tetrahydrocannabinol NONE DETECTED NONE DETECTED   Barbiturates NONE DETECTED NONE DETECTED    Comment: (NOTE) DRUG SCREEN FOR MEDICAL PURPOSES ONLY.  IF CONFIRMATION IS NEEDED FOR ANY PURPOSE, NOTIFY LAB WITHIN 5 DAYS.  LOWEST DETECTABLE LIMITS FOR URINE DRUG SCREEN Drug Class                     Cutoff (ng/mL) Amphetamine and metabolites    1000 Barbiturate and metabolites    200 Benzodiazepine                 200 Tricyclics and metabolites     300 Opiates and metabolites        300 Cocaine and metabolites        300 THC                            50 Performed at [facility], [address], [place], [place] [address]   Comprehensive metabolic panel     Status: Abnormal   Collection Time: 06/19/20  4:25 PM  Result Value Ref Range   Sodium 138 135 - 145 mmol/L   Potassium 3.8 3.5 - 5.1 mmol/L   Chloride 105 98 - 111 mmol/L   CO2 26 22 - 32 mmol/L   Glucose, Bld 98 70 - 99 mg/dL    Comment: Glucose reference range applies only to samples taken after fasting for at least 8 hours.   BUN 14 6 - 20 mg/dL   Creatinine, Ser 1.91 0.44 - 1.00 mg/dL   Calcium 9.1 8.9 - 47.8 mg/dL   Total Protein 5.8 (L) 6.5 - 8.1 g/dL   Albumin 3.9 3.5 - 5.0 g/dL   AST  19 15 - 41 U/L   ALT 24 0 - 44 U/L   Alkaline Phosphatase 38 38 - 126 U/L   Total Bilirubin 0.4 0.3 - 1.2 mg/dL   GFR calc non Af Amer >60 >60 mL/min    GFR calc Af Amer >60 >60 mL/min   Anion gap 7 5 - 15    Comment: Performed at [facility], [address], [place], [place] [address]  Ethanol     Status: None   Collection Time: 06/19/20  4:25 PM  Result Value Ref Range   Alcohol, Ethyl (B) <10 <10 mg/dL    Comment: (NOTE) Lowest detectable limit for serum alcohol is 10 mg/dL.  For medical purposes only. Performed at [facility], [address], [place], [place] [address]   CBC with Diff     Status: None   Collection Time: 06/19/20  4:25 PM  Result Value Ref Range   WBC 7.6 4.0 - 10.5 K/uL   RBC 4.10 3.87 - 5.11 MIL/uL   Hemoglobin 13.9 12.0 - 15.0 g/dL   HCT 23.5 36 - 46 %   MCV 98.5 80.0 - 100.0 fL   MCH 33.9 26.0 - 34.0 pg   MCHC 34.4 30.0 - 36.0 g/dL   RDW 57.3 22.0 - 25.4 %   Platelets 251 150 - 400 K/uL   nRBC 0.0 0.0 - 0.2 %   Neutrophils Relative % 73 %   Neutro Abs 5.5 1.7 - 7.7 K/uL   Lymphocytes Relative 20 %   Lymphs Abs 1.5 0.7 - 4.0 K/uL   Monocytes Relative 7 %   Monocytes Absolute 0.5 0 - 1 K/uL   Eosinophils Relative 0 %   Eosinophils Absolute 0.0 0 - 0 K/uL   Basophils Relative 0 %   Basophils Absolute 0.0 0 - 0 K/uL   Immature Granulocytes 0 %   Abs Immature Granulocytes 0.03 0.00 - 0.07 K/uL    Comment: Performed at [facility], [address] [place], [place] [address]  Acetaminophen level     Status: Abnormal   Collection Time: 06/19/20  4:25 PM  Result Value Ref Range   Acetaminophen (Tylenol), Serum <10 (L) 10 - 30 ug/mL    Comment: (NOTE) Therapeutic concentrations vary significantly. A range of 10-30 ug/mL  may be an effective concentration for many patients. However, some  are best treated at concentrations outside of this range. Acetaminophen concentrations >150 ug/mL at 4 hours after ingestion  and >50 ug/mL at 12 hours after ingestion are often associated with  toxic reactions.  Performed at [facility], [address], [place],  [place] [address]   Salicylate level     Status: Abnormal   Collection Time: 06/19/20  4:25 PM  Result Value Ref Range   Salicylate Lvl <7.0 (L) 7.0 - 30.0 mg/dL    Comment: Performed at [facility], [address] [place], [place] [address]  I-Stat beta hCG blood, ED     Status: None   Collection Time: 06/19/20  5:01 PM  Result Value Ref Range   I-stat hCG, quantitative <5.0 <5 mIU/mL   Comment 3            Comment:   GEST. AGE      CONC.  (mIU/mL)   <=1 WEEK        5 - 50     2 WEEKS       50 - 500  3 WEEKS       100 - 10,000     4 WEEKS     1,000 - 30,000        FEMALE AND NON-PREGNANT FEMALE:     LESS THAN 5 mIU/mL   Acetaminophen level     Status: Abnormal   Collection Time: 06/19/20  7:00 PM  Result Value Ref Range   Acetaminophen (Tylenol), Serum <10 (L) 10 - 30 ug/mL    Comment: (NOTE) Therapeutic concentrations vary significantly. A range of 10-30 ug/mL  may be an effective concentration for many patients. However, some  are best treated at concentrations outside of this range. Acetaminophen concentrations >150 ug/mL at 4 hours after ingestion  and >50 ug/mL at 12 hours after ingestion are often associated with  toxic reactions.  Performed at [facility], [address] [place], [place] [address]    Blood Alcohol level:  Lab Results  Component Value Date   ETH <10 06/19/2020   Metabolic Disorder Labs:  No results found for: HGBA1C, MPG No results found for: PROLACTIN Lab Results  Component Value Date   CHOL 132 10/28/2018   TRIG 60 10/28/2018   HDL 53 10/28/2018   CHOLHDL 2.5 10/28/2018   LDLCALC 65 10/28/2018   Current Medications: Current Facility-Administered Medications  Medication Dose Route Frequency Provider Last Rate Last Admin  . acetaminophen (TYLENOL) tablet 650 mg  650 mg Oral Q6H PRN Umi Mainor I, NP      . alum & mag hydroxide-simeth (MAALOX/MYLANTA) 200-200-20 MG/5ML suspension 30 mL  30 mL Oral Q4H PRN Armandina StammerNwoko, Lilyian Quayle I, NP       . DULoxetine (CYMBALTA) DR capsule 30 mg  30 mg Oral Daily Armandina StammerNwoko, Laniya Friedl I, NP   30 mg at 06/20/20 1437  . hydrOXYzine (ATARAX/VISTARIL) tablet 25 mg  25 mg Oral Q4H PRN Naavya Postma, Nicole KindredAgnes I, NP      . magnesium hydroxide (MILK OF MAGNESIA) suspension 30 mL  30 mL Oral Daily PRN Telecia Larocque I, NP      . norethindrone (MICRONOR) 0.35 MG tablet 0.35 mg  1 tablet Oral Daily Muranda Coye I, NP      . risperiDONE (RISPERDAL) tablet 1 mg  1 mg Oral QHS Rickayla Wieland I, NP      . Melene Muller[START ON 06/22/2020] sertraline (ZOLOFT) tablet 25 mg  25 mg Oral Once Armandina StammerNwoko, Torrey Ballinas I, NP      . Melene Muller[START ON 06/21/2020] sertraline (ZOLOFT) tablet 50 mg  50 mg Oral Once Antonieta Pertlary, Greg Lawson, MD      . traZODone (DESYREL) tablet 50 mg  50 mg Oral QHS PRN Armandina StammerNwoko, Rut Betterton I, NP       PTA Medications: Medications Prior to Admission  Medication Sig Dispense Refill Last Dose  . clonazePAM (KLONOPIN) 0.5 MG tablet Take 1 tablet (0.5 mg total) by mouth 3 (three) times daily as needed for anxiety. 90 tablet 1   . INCASSIA 0.35 MG tablet Take 1 tablet by mouth daily.     . sertraline (ZOLOFT) 100 MG tablet TAKE 1 TABLET BY MOUTH EVERY DAY 90 tablet 1    Musculoskeletal: Strength & Muscle Tone: within normal limits Gait & Station: normal Patient leans: N/A  Psychiatric Specialty Exam: Physical Exam Vitals and nursing note reviewed.  HENT:     Head: Normocephalic.     Nose: Nose normal.     Mouth/Throat:     Pharynx: Oropharynx is clear.  Eyes:     Pupils: Pupils are  equal, round, and reactive to light.  Cardiovascular:     Rate and Rhythm: Normal rate.     Pulses: Normal pulses.  Pulmonary:     Effort: Pulmonary effort is normal.  Genitourinary:    Comments: Deferred Musculoskeletal:        General: Normal range of motion.     Cervical back: Normal range of motion.  Skin:    General: Skin is warm and dry.  Neurological:     Mental Status: She is alert and oriented to person, place, and time.     Review of Systems   Constitutional: Negative.   HENT: Negative.   Eyes: Negative.   Respiratory: Negative.  Negative for cough, chest tightness, shortness of breath and wheezing.   Cardiovascular: Negative for chest pain and palpitations.  Gastrointestinal: Negative for diarrhea, nausea and vomiting.  Endocrine: Negative for cold intolerance.  Genitourinary: Negative for difficulty urinating.  Musculoskeletal: Negative for arthralgias and myalgias.  Allergic/Immunologic:       Allergies: NKDA  Neurological: Negative.   Psychiatric/Behavioral: Positive for dysphoric mood, sleep disturbance and suicidal ideas. Negative for agitation, behavioral problems, confusion, decreased concentration, hallucinations and self-injury. The patient is nervous/anxious. The patient is not hyperactive.     Blood pressure 108/76, pulse 72, temperature 97.9 F (36.6 C), temperature source Oral, resp. rate 18, height  (1.727 m), weight 67.6 kg, SpO2 98 %.Body mass index is 22.66 kg/m.  General Appearance: Disheveled  Eye Contact:  Poor  Speech:  Slow and Slurred  Volume:  Decreased  Mood:  Anxious, Depressed, Dysphoric and Hopeless  Affect:  Depressed and Tearful  Thought Process:  Coherent and Descriptions of Associations: Intact  Orientation:  Full (Time, Place, and Person)  Thought Content:  Rumination  Suicidal Thoughts:  No  Homicidal Thoughts:  No  Memory:  Immediate;   Fair Recent;   Fair Remote;   Fair  Judgement:  Other:  Mildly impaired due to feling of grogginess.  Insight:  Fair  Psychomotor Activity:  Decreased  Concentration:  Concentration: Fair and Attention Span: Fair  Recall:  Good  Fund of Knowledge:  Fair  Language:  Fair  Akathisia:  Negative  Handed:  Right  AIMS (if indicated):     Assets:  Communication Skills Desire for Improvement Physical Health Social Support  ADL's:  Intact  Cognition:  WNL, but patient feeling groggy from the effect of klonopin overdose.  Sleep:  Number of  Hours: 5   Treatment Plan Summary: Daily contact with patient to assess and evaluate symptoms and progress in treatment and Medication management.  Treatment Plan/Recommendations:  1. Admit for crisis management and stabilization, estimated length of stay 3-5 days.    2. Medication management to reduce current symptoms to base line and improve the patient's overall level of functioning: See MAR, Md's SRA & treatment plan.  3.Taper off Sertraline (See MAR). 4. Initiated Cymbalta 30 mg po daily for depression. 5. Initiated Risperdal 1 mg po Q hs for racing thoughts & to augment Cymbalta for depression. 5. Continue Vistaril 25 mg po Q 4 hrs prn for anxiety. 6. Continue Trazodone 50 mg po Q hs prn for sleep. 7. Will obtain labs: Hgba1c, Lipid panel, Prolactin, TSH & u/a. 8. Enrolled in the group sessions.  Observation Level/Precautions:  15 minute checks  Laboratory:  Will obtain; hgba1c, lipid panel, prolactin, TSH, u/a  Psychotherapy: Group sessions  Medications: See MAR    Consultations: As needed.    Discharge Concerns: safety, mood  stability.  Estimated LOS: 2-4 days  Other: Admit to the 300-hall.   Physician Treatment Plan for Primary Diagnosis: MDD (major depressive disorder), recurrent episode, severe (HCC)  Long Term Goal(s): Improvement in symptoms so as ready for discharge  Short Term Goals: Ability to verbalize feelings will improve, Ability to disclose and discuss suicidal ideas and Ability to demonstrate self-control will improve  Physician Treatment Plan for Secondary Diagnosis: Principal Problem:   MDD (major depressive disorder), recurrent episode, severe (HCC) Active Problems:   Suicide attempt by benzodiazepine overdose (HCC)   Major depressive disorder, recurrent episode, severe (HCC)  Long Term Goal(s): Improvement in symptoms so as ready for discharge  Short Term Goals: Ability to identify and develop effective coping behaviors will improve, Compliance with  prescribed medications will improve and Ability to identify triggers associated with substance abuse/mental health issues will improve  I certify that inpatient services furnished can reasonably be expected to improve the patient's condition.    Armandina Stammer, NP, PMHNP, FNP-BC 9/12/20212:46 PM

## 2020-06-20 NOTE — [facility] Suicide Risk Assessment (Signed)
[facility] Admission Suicide Risk Assessment   Nursing information obtained from:  Patient Demographic factors:  Adolescent or young adult Current Mental Status:  Suicidal ideation indicated by patient, Suicidal ideation indicated by others, Self-harm behaviors, Intention to act on suicide plan, Suicide plan Loss Factors:  NA (Stress at work and in relationship) Historical Factors:  Impulsivity Risk Reduction Factors:  Living with another person, especially a relative, Employed, Sense of responsibility to family  Total Time spent with patient: 1 hour Principal Problem: MDD (major depressive disorder), recurrent episode, severe (HCC) Diagnosis:  Principal Problem:   MDD (major depressive disorder), recurrent episode, severe (HCC) Active Problems:   Anxiety disorder, unspecified   Suicide attempt by benzodiazepine overdose (HCC)   Major depressive disorder, recurrent episode, severe (HCC)   Insomnia  Subjective Data: "I did not want to be alive."  History of Present Illness: This is the first inpatient psychiatric admission for this 29 year old Caucasian female with prior hx of major depression & anxiety disorder. She is being admitted to the [facility] from the [facility] long hospital ED with complaint of worsening symptoms of depression & intentional suicide attempt by overdose on two handful of klonopin 0.5 mg tablets. She was apparently was taken to the ED by her parents & husband. After evaluation at the ED & medical stabilization, he was sent to the [facility] for further evaluation & treatment. During this admission evaluation, Teiana reports, "Yesterday around 3:00 PM, I was taken to the [facility] ED by my husband & my parents. I attempted to overdose on 2 handful of klonopin tablets. I thought it would be better if I die & not take up space any longer. I have felt like this for a long time. 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 I keep failing  them. My husband has been trying to do everything whereby I was feeling useless because I could not help him. 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 But, I did not know how demanding the job could become. I could not keep up with all that I needed to do. I could not meet the needs of these students. I was not doing a good job. I felt like I'm a failure & I'm failing these kids. I have been living with anxiety & depression most of my life. My depression & anxiety are it's worse now. I'm tired of living inside of my body. I'm no longer able to hold food because of anxiety. I keep throwing up after meals.I don't sleep at night because my mind will not stop racing. My mind does not shut off. I have been on Sertraline for a long time, but it is not working. This medicine has not helped me any. I have been on both klonopin & Sertraline x 18 months. The suicidal thoughts has been going on for many weeks now. This is the first time I have actually carried it out. I need help". During this assessment, Brailey presents groggy & sluggish, but oriented".  Associated Signs/Symptoms:  Depression Symptoms:  depressed mood, anhedonia, insomnia, feelings of worthlessness/guilt, difficulty concentrating, hopelessness, suicidal thoughts with specific plan, suicidal attempt, anxiety,  (Hypo) Manic Symptoms:  Impulsivity, Lability of Mood,  Anxiety Symptoms:  Excessive Worry,  Psychotic Symptoms:  Denies  PTSD Symptoms: NA   Past Psychiatric History: Major depression, Anxiety disorder.  Is the patient at risk to self?  No.  Has the patient been a risk to self in the past 6 months? Yes.    Has the patient been a risk to self within the distant past? Yes.    Is the patient a risk to others? No.  Has the patient been a risk to others in the past 6 months? No.  Has the patient been a risk to others within the distant past? No.   Prior Inpatient Therapy:  Denies  Prior Outpatient Therapy: Denies.   Continued Clinical Symptoms:  Alcohol Use Disorder Identification Test Final Score (AUDIT): 1 The "Alcohol Use Disorders Identification Test", Guidelines for Use in Primary Care, Second Edition.  World Science writer [facility]). Score between 0-7:  no or low risk or alcohol related problems. Score between 8-15:  moderate risk of alcohol related problems. Score between 16-19:  high risk of alcohol related problems. Score 20 or above:  warrants further diagnostic evaluation for alcohol dependence and treatment.   CLINICAL FACTORS:   Severe Anxiety and/or Agitation Depression:   Anhedonia Hopelessness Impulsivity Insomnia Severe   Musculoskeletal: Strength & Muscle Tone: within normal limits Gait & Station: normal Patient leans: N/A  Psychiatric Specialty Exam: Physical Exam Vitals and nursing note reviewed.  HENT:     Head: Normocephalic.     Nose: Nose normal.     Mouth/Throat:     Pharynx: Oropharynx is clear.  Eyes:     Pupils: Pupils are equal, round, and reactive to light.  Cardiovascular:     Rate and Rhythm: Normal rate.     Pulses: Normal pulses.  Pulmonary:     Effort: Pulmonary effort is normal.  Genitourinary:    Comments: Deferred Musculoskeletal:        General: Normal range of motion.     Cervical back: Normal range of motion.  Skin:    General: Skin is warm and dry.  Neurological:     Mental Status: She is alert and oriented to person, place, and time.     Review of Systems  Constitutional: Negative.   HENT: Negative.   Eyes: Negative.   Respiratory: Negative.  Negative for cough, chest tightness, shortness of breath and wheezing.   Cardiovascular: Negative for chest pain and palpitations.  Gastrointestinal: Negative for diarrhea, nausea and vomiting.  Endocrine: Negative for cold intolerance.  Genitourinary: Negative for difficulty urinating.  Musculoskeletal: Negative for arthralgias and  myalgias.  Allergic/Immunologic:       Allergies: NKDA  Neurological: Negative.   Psychiatric/Behavioral: Positive for dysphoric mood, sleep disturbance and suicidal ideas. Negative for agitation, behavioral problems, confusion, decreased concentration, hallucinations and self-injury. The patient is nervous/anxious. The patient is not hyperactive.     Blood pressure 108/76, pulse 72, temperature 97.9 F (36.6 C), temperature source Oral, resp. rate 18, height 5\' 8"  (1.727 m), weight 67.6 kg, SpO2 98 %.Body mass index is 22.66 kg/m.  General Appearance: Disheveled  Eye Contact:  Poor  Speech:  Slow and Slurred  Volume:  Decreased  Mood:  Anxious, Depressed, Dysphoric and Hopeless  Affect:  Depressed and Tearful  Thought Process:  Coherent and Descriptions of Associations: Intact  Orientation:  Full (Time, Place, and Person)  Thought Content:  Rumination  Suicidal Thoughts:  No  Homicidal Thoughts:  No  Memory:  Immediate;   Fair Recent;   Fair Remote;   Fair  Judgement:  Other:  Mildly impaired due to feeling of grogginess.  Insight:  Fair  Psychomotor Activity:  Decreased  Concentration:  Concentration: Fair and  Attention Span: Fair  Recall:  Good  Fund of Knowledge:  Fair  Language:  Fair  Akathisia:  Negative  Handed:  Right  AIMS (if indicated):     Assets:  Communication Skills Desire for Improvement Physical Health Social Support  ADL's:  Intact  Cognition:  WNL, but patient feeling groggy from the effect of klonopin overdose.  Sleep:  Number of Hours: 5   COGNITIVE FEATURES THAT CONTRIBUTE TO RISK:  Thought constriction (tunnel vision)    SUICIDE RISK:   Severe:  Frequent, intense, and enduring suicidal ideation, specific plan, no subjective intent, but some objective markers of intent (i.e., choice of lethal method), the method is accessible, some limited preparatory behavior, evidence of impaired self-control, severe dysphoria/symptomatology, multiple risk  factors present, and few if any protective factors, particularly a lack of social support.  PLAN OF CARE:  Daily contact with patient to assess and evaluate symptoms and progress in treatment and Medication management.  Treatment Plan/Recommendations:  1. Admit for crisis management and stabilization, estimated length of stay 3-5 days.   2. Medication management to reduce current symptoms to base line and improve the patient's overall level of functioning:See MAR, Md's SRA &treatment plan. 3.Taper off Sertraline (See MAR). 4. Initiated Cymbalta 30 mg po daily for depression. 5. Initiated Risperdal 1 mg po Q hs for racing thoughts & to augment Cymbalta for depression. 5. Continue Vistaril 25 mg po Q 4 hrs prn for anxiety. 6. Continue Trazodone 50 mg po Q hs prn for sleep. 7. Will obtain labs: Hgba1c, Lipid panel, Prolactin, TSH & u/a. 8. Enrolled in the group sessions.  Observation Level/Precautions:  15 minute checks  Laboratory:  Will obtain; hgba1c, lipid panel, prolactin, TSH, u/a  Psychotherapy: Group sessions  Medications: See MAR    Consultations: As needed.    Discharge Concerns: safety, mood stability.  Estimated LOS: 2-4 days  Other: Admit to the 300-hall.   Physician Treatment Plan for Primary Diagnosis: MDD (major depressive disorder), recurrent episode, severe (HCC)  Long Term Goal(s): Improvement in symptoms so as ready for discharge  Short Term Goals: Ability to verbalize feelings will improve, Ability to disclose and discuss suicidal ideas and Ability to demonstrate self-control will improve  Physician Treatment Plan for Secondary Diagnosis: Principal Problem:   MDD (major depressive disorder), recurrent episode, severe (HCC) Active Problems:   Suicide attempt by benzodiazepine overdose (HCC)   Major depressive disorder, recurrent episode, severe (HCC)   I certify that inpatient services furnished can reasonably be expected to improve the patient's  condition.   Mariel Craft, MD 06/20/2020, 8:24 PM

## 2020-06-20 NOTE — Progress Notes (Signed)
[place] NOVEL CORONAVIRUS (COVID-19) DAILY CHECK-OFF SYMPTOMS - answer yes or no to each - every day NO YES  Have you had a fever in the past 24 hours?  . Fever (Temp > 37.80C / 100F) X   Have you had any of these symptoms in the past 24 hours? . New Cough .  Sore Throat  .  Shortness of Breath .  Difficulty Breathing .  Unexplained Body Aches   X   Have you had any one of these symptoms in the past 24 hours not related to allergies?   . Runny Nose .  Nasal Congestion .  Sneezing   X   If you have had runny nose, nasal congestion, sneezing in the past 24 hours, has it worsened?  X   EXPOSURES - check yes or no X   Have you traveled outside the state in the past 14 days?  X   Have you been in contact with someone with a confirmed diagnosis of COVID-19 or PUI in the past 14 days without wearing appropriate PPE?  X   Have you been living in the same home as a person with confirmed diagnosis of COVID-19 or a PUI (household contact)?    X   Have you been diagnosed with COVID-19?    X              What to do next: Answered NO to all: Answered YES to anything:   Proceed with unit schedule Follow the [facility] Inpatient Flowsheet.   

## 2020-06-20 NOTE — Progress Notes (Signed)
°   06/20/20 2351  COVID-19 Daily Checkoff  Have you had a fever (temp > 37.80C/100F)  in the past 24 hours?  No  If you have had runny nose, nasal congestion, sneezing in the past 24 hours, has it worsened? No  COVID-19 EXPOSURE  Have you traveled outside the state in the past 14 days? No  Have you been in contact with someone with a confirmed diagnosis of COVID-19 or PUI in the past 14 days without wearing appropriate PPE? No  Have you been living in the same home as a person with confirmed diagnosis of COVID-19 or a PUI (household contact)? No  Have you been diagnosed with COVID-19? No

## 2020-06-20 NOTE — Telephone Encounter (Signed)
Patient has been hospitalized with drug overdose and will be admitted to [facility]. Records reviewed from ED. I have called pharmacy to cancel Klonopin and Zoloft refills given in May 2021.

## 2020-06-20 NOTE — Progress Notes (Signed)
   06/20/20 0020  Psych Admission Type (Psych Patients Only)  Admission Status Voluntary  Psychosocial Assessment  Patient Complaints Anxiety;Depression  Eye Contact Fair  Facial Expression Other (Comment) (sleepy)  Affect Depressed;Blunted  Speech Logical/coherent  Interaction Assertive  Motor Activity Slow  Appearance/Hygiene In scrubs  Behavior Characteristics Appropriate to situation  Mood Depressed;Pleasant  Thought Process  Coherency WDL  Content WDL  Delusions None reported or observed  Perception WDL  Hallucination None reported or observed  Judgment Poor  Confusion None  Danger to Self  Current suicidal ideation? Denies  Danger to Others  Danger to Others None reported or observed

## 2020-06-20 NOTE — Tx Team (Signed)
Initial Treatment Plan 06/20/2020 12:37 AM Claire Adams PIR:[id number]    PATIENT STRESSORS: Marital or family conflict Occupational concerns   PATIENT STRENGTHS: Average or above average intelligence Communication skills Financial means Supportive family/friends Work skills   PATIENT IDENTIFIED PROBLEMS: Anxiety  Depression  Suicidal Ideatoin      "learn how to love myself"           DISCHARGE CRITERIA:  Improved stabilization in mood, thinking, and/or behavior Motivation to continue treatment in a less acute level of care Need for constant or close observation no longer present Verbal commitment to aftercare and medication compliance  PRELIMINARY DISCHARGE PLAN: Outpatient therapy Participate in family therapy Return to previous living arrangement Return to previous work or school arrangements  PATIENT/FAMILY INVOLVEMENT: This treatment plan has been presented to and reviewed with the patient, Claire Adams.  The patient and family have been given the opportunity to ask questions and make suggestions.  Juliann Pares, RN 06/20/2020, 12:37 AM

## 2020-06-20 NOTE — Progress Notes (Signed)
D. Pt was eating breakfast in bed upon initial approach this am. Pt was friendly and polite during interaction, but  appeared a little groggy. Pt reported still feeling somewhat  'physically unstable' from the Klonopin OD. Pt agreed to ask for help if she felt dizzy or weak. Per pt's self inventory, pt rated her depression, hopelessness and anxiety a 10/10/3 respectively. Pt endorses passive SI (no plan), and agrees to contact staff before acting on any harmful thoughts. Pt observed this am attending group led by RN, and working on a puzzle with a peer in the dayroom. A. Labs and vitals monitored. Pt given and educated on medications. Pt supported emotionally and encouraged to express concerns and ask questions.R. Pt remains safe with 15 minute checks. Will continue POC.

## 2020-06-20 NOTE — [facility] Group Notes (Signed)
Date:  06/20/2020 Time:  1000-1045 Group Topic/Focus: PROGRESSIVE RELAXATION. A group where deep breathing is taught and tensing and relaxation muscle groups is used. Imagery is used as well.  Pts are asked to imagine 3 pillars that hold them up when they are not able to hold themselves up.  Participation Level:  Active  Participation Quality:  Appropriate  Affect:  Appropriate  Cognitive:  Oriented  Insight: Improving  Engagement in Group:  Engaged  Modes of Intervention:  Activity, Discussion, Education, and Support  Additional Comments:  Rates her energy as a 2/10. What supports her is her family, friends, coworkers, and her husband. States what she has learned "they, my family, deserve more than I can give them".  Dione Housekeeper 06/20/2020

## 2020-06-20 NOTE — Progress Notes (Signed)
   06/20/20 2353  Psych Admission Type (Psych Patients Only)  Admission Status Voluntary  Psychosocial Assessment  Patient Complaints Depression  Eye Contact Brief  Facial Expression Other (Comment) (sleepy)  Affect Blunted  Speech Logical/coherent  Interaction Assertive  Motor Activity Slow  Appearance/Hygiene In scrubs  Behavior Characteristics Appropriate to situation  Mood Depressed  Thought Process  Coherency WDL  Content WDL  Delusions None reported or observed  Perception WDL  Hallucination None reported or observed  Judgment Poor  Confusion None  Danger to Self  Current suicidal ideation? Denies  Danger to Others  Danger to Others None reported or observed

## 2020-06-21 DIAGNOSIS — T424X2A Poisoning by benzodiazepines, intentional self-harm, initial encounter: Secondary | ICD-10-CM

## 2020-06-21 DIAGNOSIS — G4700 Insomnia, unspecified: Secondary | ICD-10-CM

## 2020-06-21 LAB — LIPID PANEL
Cholesterol: 192 mg/dL (ref 0–200)
HDL: 64 mg/dL (ref >40)
LDL Cholesterol: 118 mg/dL — ABNORMAL HIGH (ref 0–99)
Total CHOL/HDL Ratio: 3 RATIO
Triglycerides: 52 mg/dL (ref <150)
VLDL: 10 mg/dL (ref 0–40)

## 2020-06-21 LAB — HEMOGLOBIN A1C
Hgb A1c MFr Bld: 5.1 % (ref 4.8–5.6)
Mean Plasma Glucose: 99.67 mg/dL

## 2020-06-21 LAB — TSH: TSH: 1.596 u[IU]/mL (ref 0.350–4.500)

## 2020-06-21 MED ORDER — DULOXETINE HCL 20 MG PO CPEP
40.0000 mg | ORAL_CAPSULE | Freq: Every day | ORAL | Status: DC
  Filled 2020-06-21 (×2): qty 2

## 2020-06-21 MED ORDER — DULOXETINE HCL 60 MG PO CPEP
60.0000 mg | ORAL_CAPSULE | Freq: Every day | ORAL | Status: DC
  Administered 2020-06-22 – 2020-06-25 (×4): 60 mg via ORAL
  Filled 2020-06-21 (×6): qty 1

## 2020-06-21 MED ORDER — LORAZEPAM 1 MG PO TABS: 1.0000 mg | ORAL_TABLET | Freq: Four times a day (QID) | ORAL | Status: DC | PRN

## 2020-06-21 NOTE — Progress Notes (Signed)
[facility] MD Progress Note  06/21/2020 1:10 PM KEIMYA BRIDDELL  MRN:  [id number]   Subjective: Patient states " I have extremely low self esteem". " I just want to go to sleep and never wake up". She admits to suicidal ideations " It's there all the time, just wanting to not get up ever". She states she feels really depressed, low energy, hopeless and anxious. She states she ate her breakfast this morning and slept well last night without using sleep medications. She states she feels guilty about making her husband miserable all the time and just wants it all to be over. She denies auditory hallucinations or visual hallucinations.  Objective: Patient is seen and examined. She looks tired, sluggish but was able to talk and sit through the interview. She was polite & respectful, calm, cooperative, oriented * 3. She looks extremely depressed, anxious. She does not seem like responding to the internal stimuli. She promises to contract for safety if needed. She is encouraged to write about what thoughts contributing to her low self esteem. She is counseled about medication compliance and therapies and she shows good understanding. Her blood pressure 108/76, pulse 72, temperature 97.9 F (36.6 C), temperature source Oral, resp. rate 18. She slept 6.75 hours yesterday. Her lab results shows LDL increased to 118, HbA1c 5.1, TSH 1.596, and minimal abnormal urinalysis.   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   Pt states only one occurance   Ovarian cyst     Past Surgical History:  Procedure Laterality Date   bilateral ovarian cystectomy Bilateral 2019   broke left arm Left 2006   broke right arm  2018   LAPAROSCOPIC OVARIAN CYSTECTOMY Bilateral    severe ligament tear right foot     WISDOM TOOTH EXTRACTION     Family History:  Family History  Problem Relation Age of Onset   Healthy Mother    Arthritis Mother    Healthy Father    Hypertension Father    Arthritis Maternal Grandmother    Stroke Paternal Grandmother    Family Psychiatric  History: See H & P Social History:  Social History   Substance and Sexual Activity  Alcohol Use Yes     Social History   Substance and Sexual Activity  Drug Use Never    Social History   Socioeconomic History   Marital status: Married    Spouse name: Not on file   Number of children: Not on file   Years of education: Not on file   Highest education level: Not on file  Occupational History   Not on file  Tobacco Use   Smoking status: Never Smoker   Smokeless tobacco: Never Used  Vaping Use   Vaping Use: Never used  Substance and Sexual Activity   Alcohol use: Yes   Drug use: Never   Sexual activity: Yes    Birth control/protection: Injection  Other Topics Concern   Not on file  Social History Narrative  Not on file   Social Determinants of Health   Financial Resource Strain:    Difficulty of Paying Living Expenses: Not on file  Food Insecurity:    Worried About Running Out of Food in the Last Year: Not on file   Ran Out of Food in the Last Year: Not on file  Transportation Needs:    Lack of Transportation (Medical): Not on file   Lack of Transportation (Non-Medical): Not on file  Physical Activity:    Days of Exercise per Week: Not on file   Minutes of Exercise per Session: Not on file  Stress:    Feeling of Stress : Not on file  Social  Connections:    Frequency of Communication with Friends and Family: Not on file   Frequency of Social Gatherings with Friends and Family: Not on file   Attends Religious Services: Not on file   Active Member of Clubs or Organizations: Not on file   Attends Banker Meetings: Not on file   Marital Status: Not on file   Additional Social History:                         Sleep: Good  Appetite:  Good  Current Medications: Current Facility-Administered Medications  Medication Dose Route Frequency Provider Last Rate Last Admin   acetaminophen (TYLENOL) tablet 650 mg  650 mg Oral Q6H PRN Armandina Stammer I, NP       alum & mag hydroxide-simeth (MAALOX/MYLANTA) 200-200-20 MG/5ML suspension 30 mL  30 mL Oral Q4H PRN Armandina Stammer I, NP       [START ON 06/22/2020] DULoxetine (CYMBALTA) DR capsule 40 mg  40 mg Oral Daily Jazzalynn Rhudy, Geralynn Rile, MD       hydrOXYzine (ATARAX/VISTARIL) tablet 25 mg  25 mg Oral Q4H PRN Nwoko, Agnes I, NP       magnesium hydroxide (MILK OF MAGNESIA) suspension 30 mL  30 mL Oral Daily PRN Nwoko, Agnes I, NP       norethindrone (MICRONOR) 0.35 MG tablet 0.35 mg  1 tablet Oral Daily Armandina Stammer I, NP       [START ON 06/22/2020] sertraline (ZOLOFT) tablet 25 mg  25 mg Oral Once Armandina Stammer I, NP       traZODone (DESYREL) tablet 50 mg  50 mg Oral QHS PRN Armandina Stammer I, NP        Lab Results:  Results for orders placed or performed during the hospital encounter of 06/19/20 (from the past 48 hour(s))  Urinalysis, Routine w reflex microscopic Urine, Clean Catch     Status: Abnormal   Collection Time: 06/20/20  5:33 PM  Result Value Ref Range   Color, Urine YELLOW YELLOW   APPearance HAZY (A) CLEAR   Specific Gravity, Urine 1.030 1.005 - 1.030   pH 6.0 5.0 - 8.0   Glucose, UA NEGATIVE NEGATIVE mg/dL   Hgb urine dipstick NEGATIVE NEGATIVE   Bilirubin Urine NEGATIVE NEGATIVE   Ketones, ur NEGATIVE NEGATIVE mg/dL   Protein, ur NEGATIVE NEGATIVE  mg/dL   Nitrite NEGATIVE NEGATIVE   Leukocytes,Ua TRACE (A) NEGATIVE   RBC / HPF 0-5 0 - 5 RBC/hpf   WBC, UA 0-5 0 - 5 WBC/hpf   Bacteria, UA RARE (A) NONE SEEN   Squamous Epithelial / LPF 0-5 0 - 5   Mucus PRESENT     Comment: Performed at [facility], [address], [place], [place] [address]  Hemoglobin A1c  Status: None   Collection Time: 06/21/20  6:46 AM  Result Value Ref Range   Hgb A1c MFr Bld 5.1 4.8 - 5.6 %    Comment: (NOTE) Pre diabetes:          5.7%-6.4%  Diabetes:              >6.4%  Glycemic control for   <7.0% adults with diabetes    Mean Plasma Glucose 99.67 mg/dL    Comment: Performed at [facility], [address] [place], [place] [address]  Lipid panel     Status: Abnormal   Collection Time: 06/21/20  6:46 AM  Result Value Ref Range   Cholesterol 192 0 - 200 mg/dL   Triglycerides 52 <272<150 mg/dL   HDL 64 >53>40 mg/dL   Total CHOL/HDL Ratio 3.0 RATIO   VLDL 10 0 - 40 mg/dL   LDL Cholesterol 664118 (H) 0 - 99 mg/dL    Comment:        Total Cholesterol/HDL:CHD Risk Coronary Heart Disease Risk Table                     Men   Women  1/2 Average Risk   3.4   3.3  Average Risk       5.0   4.4  2 X Average Risk   9.6   7.1  3 X Average Risk  23.4   11.0        Use the calculated Patient Ratio above and the CHD Risk Table to determine the patient's CHD Risk.        ATP III CLASSIFICATION (LDL):  <100     mg/dL   Optimal  [id number]  mg/dL   Near or Above                    Optimal  130-159  mg/dL   Borderline  [id number]  mg/dL   High  >875>190     mg/dL   Very High Performed at [facility], [address] [place], [place] [address]   TSH     Status: None   Collection Time: 06/21/20  6:46 AM  Result Value Ref Range   TSH 1.596 0.350 - 4.500 uIU/mL    Comment: Performed by a 3rd Generation assay with a functional sensitivity of <=0.01 uIU/mL. Performed at [facility], [address] [place], [place]  [address]     Blood Alcohol level:  Lab Results  Component Value Date   ETH <10 06/19/2020    Metabolic Disorder Labs: Lab Results  Component Value Date   HGBA1C 5.1 06/21/2020   MPG 99.67 06/21/2020   No results found for: PROLACTIN Lab Results  Component Value Date   CHOL 192 06/21/2020   TRIG 52 06/21/2020   HDL 64 06/21/2020   CHOLHDL 3.0 06/21/2020   VLDL 10 06/21/2020   LDLCALC 118 (H) 06/21/2020   LDLCALC 65 10/28/2018    Physical Findings: AIMS: Facial and Oral Movements Muscles of Facial Expression: None, normal Lips and Perioral Area: None, normal Jaw: None, normal Tongue: None, normal,Extremity Movements Upper (arms, wrists, hands, fingers): None, normal Lower (legs, knees, ankles, toes): None, normal, Trunk Movements Neck, shoulders, hips: None, normal, Overall Severity Severity of abnormal movements (highest score from questions above): None, normal Incapacitation due to abnormal movements: None, normal Patient's awareness of abnormal movements (rate only patient's report): No Awareness, Dental Status Current problems with teeth and/or dentures?:  No Does patient usually wear dentures?: No  CIWA:    COWS:     Musculoskeletal: Strength & Muscle Tone: within normal limits Gait & Station: normal Patient leans: N/A  Psychiatric Specialty Exam: Physical Exam Vitals and nursing note reviewed.  HENT:     Head: Normocephalic.     Nose: Nose normal.     Mouth/Throat:     Pharynx: Oropharynx is clear.  Eyes:     Pupils: Pupils are equal, round, and reactive to light.  Cardiovascular:     Rate and Rhythm: Normal rate.     Pulses: Normal pulses.  Pulmonary:     Effort: Pulmonary effort is normal.  Genitourinary:    Comments: Deferred Musculoskeletal:        General: Normal range of motion.     Cervical back: Normal range of motion.  Skin:    General: Skin is warm and dry.  Neurological:     Mental Status: She is alert and oriented to person, place,  and time.     Review of Systems  Constitutional: Negative.   HENT: Negative.   Eyes: Negative.   Respiratory: Negative.  Negative for cough, chest tightness, shortness of breath and wheezing.   Cardiovascular: Negative for chest pain and palpitations.  Gastrointestinal: Negative for diarrhea, nausea and vomiting.  Endocrine: Negative for cold intolerance.  Genitourinary: Negative for difficulty urinating.  Musculoskeletal: Negative for arthralgias and myalgias.  Allergic/Immunologic:       Allergies: NKDA  Neurological: Negative.   Psychiatric/Behavioral: Positive for dysphoric mood, sleep disturbance and suicidal ideas. Negative for agitation, behavioral problems, confusion, decreased concentration, hallucinations and self-injury. The patient is nervous/anxious. The patient is not hyperactive.     Blood pressure 108/76, pulse 72, temperature 97.9 F (36.6 C), temperature source Oral, resp. rate 18, height 5\' 8"  (1.727 m), weight 67.6 kg, SpO2 98 %.Body mass index is 22.66 kg/m.  General Appearance: Casual  Eye Contact:  Fair  Speech:  Slurred  Volume:  Decreased  Mood:  Anxious, Depressed, Dysphoric, Hopeless and Worthless  Affect:  Restricted  Thought Process:  Linear and Descriptions of Associations: Intact  Orientation:  Full (Time, Place, and Person)  Thought Content:  Logical  Suicidal Thoughts:  Yes.  without intent/plan  Homicidal Thoughts:  No  Memory:  Immediate;   Good Recent;   Good Remote;   Good  Judgement:  Fair  Insight:  Fair  Psychomotor Activity:  Decreased  Concentration:  Concentration: Fair and Attention Span: Fair  Recall:  Good  Fund of Knowledge:  Good  Language:  Good  Akathisia:  Negative  Handed:  Right  AIMS (if indicated):     Assets:  Communication Skills Desire for Improvement Financial Resources/Insurance Housing Intimacy Physical Health Resilience Social Support Talents/Skills Vocational/Educational  ADL's:  Intact  Cognition:   WNL  Sleep:  Number of Hours: 6.75   Assessment:  This is the first inpatient psychiatric admission for this 29 year old Caucasian female with prior hx of major depression & anxiety disorder. She is being admitted to the [facility] from the [facility] long hospital ED with complaint of worsening symptoms of depression & intentional suicide attempt by overdose on two handful of klonopin 0.5 mg tablets.  She looks depressed, anxious, endorses passive suicidal ideations with a desire to just sleep and never wake up.  Treatment Plan Summary: Daily contact with patient to assess and evaluate symptoms and progress in treatment.   Plan:   1.Taper off Sertraline (See MAR). 2.  Increase Cymbalta to 40 mg PO daily for depression. 3. Stop Risperdal 1 mg po Q hs. 4. Continue Vistaril 25 mg po Q 4 hrs prn for anxiety. 5. Continue Trazodone 50 mg po Q hs prn for sleep. 6. CIWA for Klonopin withdrawal . 7. Continue Tylenol 650 mg for mild pain. 8. Continue Maalox 30 ml PO Q4H PRN for indigestion. 9. Continue Micronor 0.35 mg. 10. Continue milk of magnesia 30 ml for mild constipation. 11. Encourgement to attend group therapies. 12. Disposition in progress.     Arnoldo Lenis, MD 06/21/2020, 1:10 PM

## 2020-06-21 NOTE — Progress Notes (Addendum)
   06/20/20 0758  Vital Signs  Temp 97.9 F (36.6 C)  Temp Source Oral  Pulse Rate 72  BP 108/76  BP Method Automatic  Patient Position (if appropriate) Sitting  Pain Assessment  Pain Scale 0-10  Pain Score 0   D: Patient denies SI/HI/AVH. Patient rated anxiety 5/10 and depression 8/10. Patient reported feeling hopeless with poor concentration. A:  Patient took scheduled medicine.  Support and encouragement provided Routine safety checks conducted every 15 minutes. Patient  Informed to notify staff with any concerns.   R:  Safety maintained.  Late note:  1837 patient was tearful after talking to family. Patient rated anxiety 8/10, gave 25 mg of vistaril. Patient stated that if her husband calls tell him she is asleep.

## 2020-06-21 NOTE — Progress Notes (Signed)
Psychoeducational Group Note  Date:  06/21/2020 Time:  2223  Group Topic/Focus:  Wrap-Up Group:   The focus of this group is to help patients review their daily goal of treatment and discuss progress on daily workbooks.  Participation Level: Did Not Attend  Participation Quality:  Not Applicable  Affect:  Not Applicable  Cognitive:  Not Applicable  Insight:  Not Applicable  Engagement in Group: Not Applicable  Additional Comments:  The patient did not attend group this evening.   Hazle Coca S 06/21/2020, 10:23 PM

## 2020-06-21 NOTE — BHH Group Notes (Signed)
Occupational Therapy Group Note Date: 06/21/2020 Group Topic/Focus: Brain Fitness  Group Description: Group encouraged increased social engagement and participation through discussion/activity focused on brain fitness. Patients were provided education on various brain fitness activities/strategies, with explanation provided on the qualifying factors including: one, that is has to be challenging/hard and two, it has to be something that you do not do every day. Patients engaged actively during group session in various brain fitness activities to increase attention, concentration, and problem-solving skills. Discussion followed with a focus on identifying the benefits of brain fitness activities as use for adaptive coping strategies and distraction.   Participation Level: Pt was invited however did not attend this OT session.   Plan: Continue to engage patient in OT groups 2 - 3x/week.  06/21/2020  Vendela Troung, MOT, OTR/L  

## 2020-06-21 NOTE — Progress Notes (Signed)
Recreation Therapy Notes  Date: 9.13.21 Time: 0930 Location: 300 [address] Dayroom  Group Topic: Stress Management  Goal Area(s) Addresses:  Patient will identify positive stress management techniques. Patient will identify benefits of using stress management post d/c.  Behavioral Response: Engaged  Intervention: Stress Management  Activity: Meditation.  LRT played a meditation that focused on being more resilient in the face adversity.  Patients were to listen and follow along as the meditation played to engage in meditation.    Education:  Stress Management, Discharge Planning.   Education Outcome: Acknowledges Education  Clinical Observations/Feedback: Pt attended and participated in activity.    Caroll Rancher, LRT/CTRS        Caroll Rancher A 06/21/2020 11:43 AM

## 2020-06-21 NOTE — Tx Team (Signed)
Interdisciplinary Treatment and Diagnostic Plan Update  06/21/2020 Time of Session: 3:25PM Claire Adams MRN: [id number]  Principal Diagnosis: MDD (major depressive disorder), recurrent episode, severe ([place])  Secondary Diagnoses: Principal Problem:   MDD (major depressive disorder), recurrent episode, severe ([place]) Active Problems:   Anxiety disorder, unspecified   Suicide attempt by benzodiazepine overdose ([place])   Major depressive disorder, recurrent episode, severe ([place])   Insomnia   Current Medications:  Current Facility-Administered Medications  Medication Dose Route Frequency Provider Last Rate Last Admin   acetaminophen (TYLENOL) tablet 650 mg  650 mg Oral Q6H PRN Nwoko, Herbert Pun I, NP       alum & mag hydroxide-simeth (MAALOX/MYLANTA) 200-200-20 MG/5ML suspension 30 mL  30 mL Oral Q4H PRN Lindell Spar I, NP       [START ON 06/22/2020] DULoxetine (CYMBALTA) DR capsule 60 mg  60 mg Oral Daily Sharma Covert, MD       hydrOXYzine (ATARAX/VISTARIL) tablet 25 mg  25 mg Oral Q4H PRN Lindell Spar I, NP       LORazepam (ATIVAN) tablet 1 mg  1 mg Oral Q6H PRN Sharma Covert, MD       magnesium hydroxide (MILK OF MAGNESIA) suspension 30 mL  30 mL Oral Daily PRN Lindell Spar I, NP       norethindrone (MICRONOR) 0.35 MG tablet 0.35 mg  1 tablet Oral Daily Lindell Spar I, NP       [START ON 06/22/2020] sertraline (ZOLOFT) tablet 25 mg  25 mg Oral Once Lindell Spar I, NP       traZODone (DESYREL) tablet 50 mg  50 mg Oral QHS PRN Lindell Spar I, NP       PTA Medications: Medications Prior to Admission  Medication Sig Dispense Refill Last Dose   clonazePAM (KLONOPIN) 0.5 MG tablet Take 1 tablet (0.5 mg total) by mouth 3 (three) times daily as needed for anxiety. 90 tablet 1    INCASSIA 0.35 MG tablet Take 1 tablet by mouth daily.      sertraline (ZOLOFT) 100 MG tablet TAKE 1 TABLET BY MOUTH EVERY DAY 90 tablet 1     Patient Stressors: Marital or family  conflict Occupational concerns  Patient Strengths: Average or above average intelligence Child psychotherapist Supportive family/friends Work skills  Treatment Modalities: Medication Management, Group therapy, Case management,  1 to 1 session with clinician, Psychoeducation, Recreational therapy.   Physician Treatment Plan for Primary Diagnosis: MDD (major depressive disorder), recurrent episode, severe ([place]) Long Term Goal(s): Improvement in symptoms so as ready for discharge Improvement in symptoms so as ready for discharge   Short Term Goals: Ability to verbalize feelings will improve Ability to disclose and discuss suicidal ideas Ability to demonstrate self-control will improve Ability to identify and develop effective coping behaviors will improve Compliance with prescribed medications will improve Ability to identify triggers associated with substance abuse/mental health issues will improve  Medication Management: Evaluate patient's response, side effects, and tolerance of medication regimen.  Therapeutic Interventions: 1 to 1 sessions, Unit Group sessions and Medication administration.  Evaluation of Outcomes: Not Met  Physician Treatment Plan for Secondary Diagnosis: Principal Problem:   MDD (major depressive disorder), recurrent episode, severe ([place]) Active Problems:   Anxiety disorder, unspecified   Suicide attempt by benzodiazepine overdose ([place])   Major depressive disorder, recurrent episode, severe ([place])   Insomnia  Long Term Goal(s): Improvement in symptoms so as ready for discharge Improvement in symptoms so as ready for discharge  Short Term Goals: Ability to verbalize feelings will improve Ability to disclose and discuss suicidal ideas Ability to demonstrate self-control will improve Ability to identify and develop effective coping behaviors will improve Compliance with prescribed medications will improve Ability to identify triggers  associated with substance abuse/mental health issues will improve     Medication Management: Evaluate patient's response, side effects, and tolerance of medication regimen.  Therapeutic Interventions: 1 to 1 sessions, Unit Group sessions and Medication administration.  Evaluation of Outcomes: Not Met   RN Treatment Plan for Primary Diagnosis: MDD (major depressive disorder), recurrent episode, severe ([place]) Long Term Goal(s): Knowledge of disease and therapeutic regimen to maintain health will improve  Short Term Goals: Ability to remain free from injury will improve, Ability to participate in decision making will improve, Ability to verbalize feelings will improve, Ability to disclose and discuss suicidal ideas and Ability to identify and develop effective coping behaviors will improve  Medication Management: RN will administer medications as ordered by provider, will assess and evaluate patient's response and provide education to patient for prescribed medication. RN will report any adverse and/or side effects to prescribing provider.  Therapeutic Interventions: 1 on 1 counseling sessions, Psychoeducation, Medication administration, Evaluate responses to treatment, Monitor vital signs and CBGs as ordered, Perform/monitor CIWA, COWS, AIMS and Fall Risk screenings as ordered, Perform wound care treatments as ordered.  Evaluation of Outcomes: Not Met   LCSW Treatment Plan for Primary Diagnosis: MDD (major depressive disorder), recurrent episode, severe ([place]) Long Term Goal(s): Safe transition to appropriate next level of care at discharge, Engage patient in therapeutic group addressing interpersonal concerns.  Short Term Goals: Engage patient in aftercare planning with referrals and resources, Increase social support, Increase ability to appropriately verbalize feelings, Increase emotional regulation and Facilitate patient progression through stages of change regarding substance use diagnoses  and concerns  Therapeutic Interventions: Assess for all discharge needs, 1 to 1 time with Social worker, Explore available resources and support systems, Assess for adequacy in community support network, Educate family and significant other(s) on suicide prevention, Complete Psychosocial Assessment, Interpersonal group therapy.  Evaluation of Outcomes: Not Met   Progress in Treatment: Attending groups: Yes. Participating in groups: Yes. Taking medication as prescribed: Yes. Toleration medication: Yes. Family/Significant other contact made: No, will contact:  Pt's husband. Patient understands diagnosis: Yes. Discussing patient identified problems/goals with staff: Yes. Medical problems stabilized or resolved: Yes. Denies suicidal/homicidal ideation: Yes. Issues/concerns per patient self-inventory: Yes. Other: None  New problem(s) identified: No, Describe:  None  New Short Term/Long Term Goal(s): Pt continues to verbalize some regret that overdose was not successful. SW will encourage Pt to attend groups in order to process these feelings.  Patient Goals:  "To feel better"  Discharge Plan or Barriers: None, SW will continue to assess.  Reason for Continuation of Hospitalization: Depression Medication stabilization  Estimated Length of Stay: 3-5 Days  Attendees: Patient: Claire Adams 06/21/2020 4:29 PM  Physician: Dr. Mallie Darting  06/21/2020 4:29 PM  Nursing:  06/21/2020 4:29 PM  RN Care Manager: 06/21/2020 4:29 PM  Social Worker: Freddi Che, LCSW 06/21/2020 4:29 PM  Recreational Therapist:  06/21/2020 4:29 PM  Other:  06/21/2020 4:29 PM  Other:  06/21/2020 4:29 PM  Other: 06/21/2020 4:29 PM    Scribe for Treatment Team: Freddi Che, LCSW 06/21/2020 4:29 PM

## 2020-06-21 NOTE — Progress Notes (Signed)
Spiritual care group on grief and loss facilitated by chaplain Avrianna Smart  Group Goal:  Support / Education around grief and loss Members engage in facilitated group support and psycho-social education.  Group Description:  Following introductions and group rules, group members engaged in facilitated group dialog and support around topic of loss, with particular support around experiences of loss in their lives. Group Identified types of loss (relationships / self / things) and identified patterns, circumstances, and changes that precipitate losses. Reflected on thoughts / feelings around loss, normalized grief responses, and recognized variety in grief experience. Patient Progress: Did not attend  

## 2020-06-22 DIAGNOSIS — F411 Generalized anxiety disorder: Secondary | ICD-10-CM

## 2020-06-22 LAB — PROLACTIN: Prolactin: 48 ng/mL — ABNORMAL HIGH (ref 4.8–23.3)

## 2020-06-22 NOTE — Progress Notes (Signed)
The patieint's positive event for the day is that she felt more "connected" with her peers today. She admits to feeling down but did not go into further detail. Her goal for tomorrow is to stay awake all day long and to help one of her peers.

## 2020-06-22 NOTE — Progress Notes (Addendum)
   06/22/20 0627  Vital Signs  Temp 99 F (37.2 C)  Temp Source Oral  Pulse Rate 87  BP 95/68  BP Location Right Arm  BP Method Automatic  Patient Position (if appropriate) Sitting   D:  Patient denies SI/HI/AVH. Patient rated anxiety 5/10.  Patient attended groups, was out in open areas and was social with peers and staff. A:  Patient took scheduled medicine. Pt. Was given 25 mg of vistaril for anxiety. Pt. reported that vistaril was helpful. Pt.'s mom brought in her own birthcontrol that was checked by the pharmacy and was dosed to the patient. Support and encouragement provided Routine safety checks conducted every 15 minutes. Patient  Informed to notify staff with any concerns.   R:  Safety maintained.  Late note: 1650 Patient's husband stopped by to pick up her things from her locker all things were taken from her locker and sent home with husband. Husband brought in  clean clothes and a note for the patient.   Patient's husband wanted to make sure patient had what she needed. Husband wanted to know if she would be out before her birthday (09-Oct-1991).Husband is a IT sales professional. Husband reported that patient has been more anxious with the COVID protocol at school and is afraid of bringing COVID home. Husband reported patient's mom was an alcoholic who left the home abruptly for about a week and that patient blames herself for her mother leaving. Husband said they have tried online counseling because COVID  Many providers are only online and they are booking several months out.
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
## 2020-06-22 NOTE — Progress Notes (Signed)
   06/22/20 0000  Psych Admission Type (Psych Patients Only)  Admission Status Voluntary  Psychosocial Assessment  Patient Complaints Anxiety;Depression  Eye Contact Brief  Facial Expression Other (Comment) (sleepy)  Affect Blunted  Speech Logical/coherent  Interaction Assertive  Motor Activity Slow  Appearance/Hygiene Unremarkable  Behavior Characteristics Appropriate to situation  Mood Depressed  Thought Process  Coherency WDL  Content WDL  Delusions None reported or observed  Perception WDL  Hallucination None reported or observed  Judgment Poor  Confusion None  Danger to Self  Current suicidal ideation? Denies  Danger to Others  Danger to Others None reported or observed

## 2020-06-23 NOTE — Progress Notes (Signed)
D Alert and Oriented Presents with sad and flat affect and preoccupied content. Patients stated she feels anxious most of the time and is working on getting better "tommorow is a better day" Patients verbally contracted for safety. Patient has minimal interaction with peers and staff this shift.  A Scheduled medications administered per Provider order and Prn Vistaril administered at [facility] for anxiety and effective. Support and encouragement provided. Routine safety checks conducted every 15 minutes. Patient notified to inform staff with problems or concerns.  R. No adverse drug reactions noted. Patient contracts for safety at this time. Will continue to monitor.
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
## 2020-06-23 NOTE — [facility] Counselor (Addendum)
Family meeting was held with pt, CSW, Dr. Gerarda Fraction, and pt's husband virtually. CSW facilitated conversation between pt and husband regarding strain in their relationship. Husband expresses love and concern and apologizes for some of his comments from a conflict prior to admission. Pt is able to express feelings of not being loved based on husband's previous comments during the conflict. She is able to acknowledge her husband's care for her and also apologizes for her part in conflict. She expresses feeling extremely overwhelmed with work and that she wants to quit. Husband is supportive of pt taking time off to focus on mental health. Pt is agreeable to taking time off and focusing on mental health tx. CSW discusses treatment options of PHP, DBT, etc. Pt at this time is tearful and expresses desire to leave meeting to sleep. At this time pt and Dr. Gerarda Fraction leave meeting.   CSW speaks further with pt husband. Husband explains that pt's parents are wanting to be able to speak with tx team. CSW will get consent from pt. Husband explains that family is wanting residential tx and has funds to cover such tx. CSW to continue to follow up with husband.  CSW spoke with pt separately about residential tx. She is agreeable and signs consent for father and mother.   CSW called pt's father and confirmed desire for residential. He discusses options between [place], [facility], and [place] in [place]. Has preference for [facility]. CSW emails [facility] family paper work to father, [email or web address].  CSW faxed pt clinicals to [facility] at [employer] [phone]. CSW called [facility] and provided pt information. CSW to complete [facility] referral paper work and fax.

## 2020-06-23 NOTE — Progress Notes (Signed)
D:  Patient's self inventory sheet, patient sleeps good, anxiety med helpful.  Good appetite, low energy level, good concentration.  Rated depression 6, Denied hopeless, anxiety 7.  Denied withdrawals.  Denied SI.  Denied physical problems.  Denied physical pain.  Goal is stay awake and attend groups.  Plans to use common space as much as possible.  Thank you for trying to help me.  No discharge plans. A:  Medications administered per MD orders.  Emotional support and encouragement given patient. R:  Denied SI and HI, contracts for safety.  Denied A/V hallucinations.  Safety maintained with 15 minute checks.

## 2020-06-23 NOTE — Progress Notes (Signed)
Adult Psychoeducational Group Note  Date:  06/23/2020 Time:  8:51 PM  Group Topic/Focus:  Wrap-Up Group:   The focus of this group is to help patients review their daily goal of treatment and discuss progress on daily workbooks.  Participation Level:  Active  Participation Quality:  Appropriate  Affect:  Appropriate  Cognitive:  Appropriate  Insight: Appropriate  Engagement in Group:  Engaged  Modes of Intervention:  Discussion  Additional Comments:  Patient attended wrap up group and participated.   Dozier Berkovich W Brinae Woods 06/23/2020, 8:51 PM

## 2020-06-24 LAB — SARS CORONAVIRUS 2 (TAT 6-24 HRS): SARS Coronavirus 2: NEGATIVE

## 2020-06-24 MED ORDER — TRAZODONE HCL 50 MG PO TABS
50.0000 mg | ORAL_TABLET | Freq: Every evening | ORAL | 0 refills | Status: DC | PRN
Start: 2020-06-24 — End: 2023-07-19

## 2020-06-24 MED ORDER — DULOXETINE HCL 60 MG PO CPEP
60.0000 mg | ORAL_CAPSULE | Freq: Every day | ORAL | 0 refills | Status: DC
Start: 2020-06-25 — End: 2022-06-06

## 2020-06-24 MED ORDER — HYDROXYZINE HCL 25 MG PO TABS: 25.0000 mg | ORAL_TABLET | ORAL | 0 refills | Status: AC | PRN

## 2020-06-24 NOTE — Progress Notes (Signed)
Pt did not attend wrap-up group   

## 2020-06-24 NOTE — Progress Notes (Signed)
[facility] MD Progress Note  06/24/2020 2:07 PM Claire Adams  MRN:  [id number]   Subjective: Patient states " I feel sad". " I don't have any thoughts to hurt myself today, I feel guilty about attempting that ". She states " Do you think I should go to residential, 30 days away from my family, locked up somewhere else". She adds " What I can I do to not dislike myself, I just think I look hideous".She states she ate her breakfast this morning and slept well last night without using sleep medications. She states she feels guilty about making her husband miserable all the time but he said some distressing words when they have a difficult conversation last time. She states it was really difficult to deal with everything yesterday but she thinks she feels little relieved about it all. She denies auditory hallucinations or visual hallucinations. She puts her mood on 6/10, 10 being most depressed. She puts her anxiety on 7/10, 10 being most anxious but identifies the unit to be a cause of her anxiety considering it feels like prison. She states she misses her family and wants to be them..  Objective: Patient is seen and examined. She looks dysphoric, anxious and minimizing her symptoms today.She  was polite & respectful, calm, cooperative, oriented * 3.  She does not seem like responding to the internal stimuli. She promises to contract for safety if needed. She was educated more about the residential's, showed videos about them and that helped with her anxiety. Her plan of discharge is go saty with family not only with husband as we are comfortable sending her to same environment.  Her blood pressure is  Blood pressure 95/67, pulse 88, temperature 98.5 F (36.9 C), temperature source Oral, resp. rate 16. She slept 6.75 hours yesterday. Her lab results shows LDL increased to 118, HbA1c 5.1, TSH 1.596, and minimal abnormal urinalysis.  Family meeting with husband Claire Adams @ [phone] on 06/24/2020: Video family meeting  was held with patient, Child psychotherapist and provider. Husband expresses his love and concern towards patient and tells her repeatedly that he loves her and want to be her forever. Patient shows hesitancy and talks about the conversation that was held on Saturday and was totally opposite of what he is expressing today. They were helped through the conversations. They both were informed about the therapies, partial hospitalizations etc. 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 They were counseled to give it time, may be take off some time off from work and work on mental illness. At a point patient states she would like to go to bed and take nap and says good bye to the husband. Husband continues his questions with social worker about the available therapies and other options for her to help with the mental illness.  Principal Problem: MDD (major depressive disorder), recurrent episode, severe (HCC) Diagnosis: Principal Problem:   MDD (major depressive disorder), recurrent episode, severe (HCC) Active Problems:   Anxiety disorder, unspecified   Suicide attempt by benzodiazepine overdose (HCC)   Major depressive disorder, recurrent episode, severe (HCC)   Insomnia  Total Time spent with patient: 25 minutes  Past Psychiatric History: See H & P  Past Medical History:  Past Medical History:  Diagnosis Date  . Broken arm 2017   Right elbow per pt  . 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  Ovarian cyst     Past Surgical History:  Procedure Laterality Date  . bilateral ovarian cystectomy Bilateral 2019  . broke left arm Left 2006  . broke right arm  2018  . LAPAROSCOPIC OVARIAN CYSTECTOMY Bilateral   . severe ligament tear right foot    . WISDOM TOOTH EXTRACTION     Family History:  Family History  Problem Relation Age of Onset  . Healthy Mother   . Arthritis Mother   . Healthy  Father   . Hypertension Father   . Arthritis Maternal Grandmother   . Stroke Paternal Grandmother    Family Psychiatric  History: See H & P Social History:  Social History   Substance and Sexual Activity  Alcohol Use Yes     Social History   Substance and Sexual Activity  Drug Use Never    Social History   Socioeconomic History  . Marital status: Married    Spouse name: Not on file  . Number of children: Not on file  . Years of education: Not on file  . Highest education level: Not on file  Occupational History  . Not on file  Tobacco Use  . Smoking status: Never Smoker  . Smokeless tobacco: Never Used  Vaping Use  . Vaping Use: Never used  Substance and Sexual Activity  . Alcohol use: Yes  . Drug use: Never  . Sexual activity: Yes    Birth control/protection: Injection  Other Topics Concern  . Not on file  Social History Narrative  . Not on file   Social Determinants of Health   Financial Resource Strain:   . Difficulty of Paying Living Expenses: Not on file  Food Insecurity:   . Worried About Programme researcher, broadcasting/film/video in the Last Year: Not on file  . Ran Out of Food in the Last Year: Not on file  Transportation Needs:   . Lack of Transportation (Medical): Not on file  . Lack of Transportation (Non-Medical): Not on file  Physical Activity:   . Days of Exercise per Week: Not on file  . Minutes of Exercise per Session: Not on file  Stress:   . Feeling of Stress : Not on file  Social Connections:   . Frequency of Communication with Friends and Family: Not on file  . Frequency of Social Gatherings with Friends and Family: Not on file  . Attends Religious Services: Not on file  . Active Member of Clubs or Organizations: Not on file  . Attends Banker Meetings: Not on file  . Marital Status: Not on file   Additional Social History:                         Sleep: Good  Appetite:  Good  Current Medications: Current  Facility-Administered Medications  Medication Dose Route Frequency Provider Last Rate Last Admin  . acetaminophen (TYLENOL) tablet 650 mg  650 mg Oral Q6H PRN Nwoko, Agnes I, NP      . alum & mag hydroxide-simeth (MAALOX/MYLANTA) 200-200-20 MG/5ML suspension 30 mL  30 mL Oral Q4H PRN Armandina Stammer I, NP      . DULoxetine (CYMBALTA) DR capsule 60 mg  60 mg Oral Daily Antonieta Pert, MD   60 mg at 06/24/20 0814  . hydrOXYzine (ATARAX/VISTARIL) tablet 25 mg  25 mg Oral Q4H PRN Armandina Stammer I, NP   25 mg at 06/24/20 1342  . LORazepam (ATIVAN) tablet 1 mg  1 mg  Oral Q6H PRN Antonieta Pert, MD      . magnesium hydroxide (MILK OF MAGNESIA) suspension 30 mL  30 mL Oral Daily PRN Armandina Stammer I, NP      . norethindrone (MICRONOR) 0.35 MG tablet 0.35 mg  1 tablet Oral Daily Armandina Stammer I, NP   0.35 mg at 06/24/20 0815  . traZODone (DESYREL) tablet 50 mg  50 mg Oral QHS PRN Armandina Stammer I, NP        Lab Results:  No results found for this or any previous visit (from the past 48 hour(s)).  Blood Alcohol level:  Lab Results  Component Value Date   ETH <10 06/19/2020    Metabolic Disorder Labs: Lab Results  Component Value Date   HGBA1C 5.1 06/21/2020   MPG 99.67 06/21/2020   Lab Results  Component Value Date   PROLACTIN 48.0 (H) 06/21/2020   Lab Results  Component Value Date   CHOL 192 06/21/2020   TRIG 52 06/21/2020   HDL 64 06/21/2020   CHOLHDL 3.0 06/21/2020   VLDL 10 06/21/2020   LDLCALC 118 (H) 06/21/2020   LDLCALC 65 10/28/2018    Physical Findings: AIMS: Facial and Oral Movements Muscles of Facial Expression: None, normal Lips and Perioral Area: None, normal Jaw: None, normal Tongue: None, normal,Extremity Movements Upper (arms, wrists, hands, fingers): None, normal Lower (legs, knees, ankles, toes): None, normal, Trunk Movements Neck, shoulders, hips: None, normal, Overall Severity Severity of abnormal movements (highest score from questions above): None,  normal Incapacitation due to abnormal movements: None, normal Patient's awareness of abnormal movements (rate only patient's report): No Awareness, Dental Status Current problems with teeth and/or dentures?: No Does patient usually wear dentures?: No  CIWA:  CIWA-Ar Total: 1 COWS:     Musculoskeletal: Strength & Muscle Tone: within normal limits Gait & Station: normal Patient leans: N/A  Psychiatric Specialty Exam: Physical Exam Vitals and nursing note reviewed.  HENT:     Head: Normocephalic.     Nose: Nose normal.     Mouth/Throat:     Pharynx: Oropharynx is clear.  Eyes:     Pupils: Pupils are equal, round, and reactive to light.  Cardiovascular:     Rate and Rhythm: Normal rate.     Pulses: Normal pulses.  Pulmonary:     Effort: Pulmonary effort is normal.  Genitourinary:    Comments: Deferred Musculoskeletal:        General: Normal range of motion.     Cervical back: Normal range of motion.  Skin:    General: Skin is warm and dry.  Neurological:     Mental Status: She is alert and oriented to person, place, and time.  Psychiatric:        Attention and Perception: Attention and perception normal.        Mood and Affect: Mood is anxious and depressed.        Speech: Speech normal.        Behavior: Behavior is cooperative.        Cognition and Memory: Cognition and memory normal.        Judgment: Judgment is inappropriate.     Review of Systems  Constitutional: Negative.   HENT: Negative.   Eyes: Negative.   Respiratory: Negative.  Negative for cough, chest tightness, shortness of breath and wheezing.   Cardiovascular: Negative for chest pain and palpitations.  Gastrointestinal: Negative for diarrhea, nausea and vomiting.  Endocrine: Negative for cold intolerance.  Genitourinary: Negative for difficulty  urinating.  Musculoskeletal: Negative for arthralgias and myalgias.  Allergic/Immunologic:       Allergies: NKDA  Neurological: Negative.    Psychiatric/Behavioral: Positive for dysphoric mood. Negative for agitation, behavioral problems, confusion, decreased concentration, hallucinations, self-injury, sleep disturbance and suicidal ideas. The patient is nervous/anxious. The patient is not hyperactive.     Blood pressure 95/67, pulse 88, temperature 98.5 F (36.9 C), temperature source Oral, resp. rate 16, height 5\' 8"  (1.727 m), weight 67.6 kg, SpO2 98 %.Body mass index is 22.66 kg/m.  General Appearance: Casual  Eye Contact:  Fair  Speech:  Normal Rate  Volume:  Decreased  Mood:  Anxious and Dysphoric  Affect:  Improved  Thought Process:  Linear and Descriptions of Associations: Intact  Orientation:  Full (Time, Place, and Person)  Thought Content:  Logical  Suicidal Thoughts:  No  Homicidal Thoughts:  No  Memory:  Immediate;   Good Recent;   Good Remote;   Good  Judgement:  Fair  Insight:  Fair  Psychomotor Activity:  Decreased  Concentration:  Concentration: Fair and Attention Span: Fair  Recall:  Good  Fund of Knowledge:  Good  Language:  Good  Akathisia:  Negative  Handed:  Right  AIMS (if indicated):     Assets:  Communication Skills Desire for Improvement Financial Resources/Insurance Housing Intimacy Physical Health Resilience Social Support Talents/Skills Vocational/Educational  ADL's:  Intact  Cognition:  WNL  Sleep:  Number of Hours: 6.75   Assessment:  This is the first inpatient psychiatric admission for this 29 year old Caucasian female with prior hx of major depression & anxiety disorder. [facility] with complaint of worsening symptoms of depression & intentional suicide attempt by overdose on two handful of klonopin 0.5 mg tablets.  She looks dysphoric, anxious, denies suicidal ideations. She looks little bright today, more comfortable with plans.  Treatment Plan Summary: Daily contact with patient to assess and evaluate symptoms and  progress in treatment.   Plan:   1. Continue Cymbalta to 60 mg PO daily for depression. 2. Continue Vistaril 25 mg po Q 4 hrs prn for anxiety. 3. Continue Trazodone 50 mg po Q hs prn for sleep. 4. CIWA for Klonopin withdrawal  And Ativan 1 mg PO Q6H for CIWA >10. 5. Continue Tylenol 650 mg for mild pain. 6. Continue Maalox 30 ml PO Q4H PRN for indigestion. 7. Continue Micronor 0.35 mg. 8. Continue milk of magnesia 30 ml for mild constipation. 9. Encourgement to attend group therapies. 10. Encouragement for medication compliance.  11. Disposition in progress.     Arnoldo LenisAnjali  Willis Holquin, MD 06/24/2020, 2:07 PM

## 2020-06-24 NOTE — BHH Suicide Risk Assessment (Signed)
[facility] Discharge Suicide Risk Assessment   Principal Problem: MDD (major depressive disorder), recurrent episode, severe (HCC) Discharge Diagnoses: Principal Problem:   MDD (major depressive disorder), recurrent episode, severe (HCC) Active Problems:   Anxiety disorder, unspecified   Suicide attempt by benzodiazepine overdose (HCC)   Major depressive disorder, recurrent episode, severe (HCC)   Insomnia   Total Time spent with patient: 20 minutes  Musculoskeletal: Strength & Muscle Tone: within normal limits Gait & Station: normal Patient leans: N/A  Psychiatric Specialty Exam: Review of Systems  All other systems reviewed and are negative.   Blood pressure 95/67, pulse 88, temperature 98.5 F (36.9 C), temperature source Oral, resp. rate 16, height 5\' 8"  (1.727 m), weight 67.6 kg, SpO2 98 %.Body mass index is 22.66 kg/m.  General Appearance: Casual  Eye Contact::  Fair  Speech:  Normal Rate409  Volume:  Decreased  Mood:  Depressed  Affect:  Congruent  Thought Process:  Coherent and Descriptions of Associations: Intact  Orientation:  Full (Time, Place, and Person)  Thought Content:  Logical  Suicidal Thoughts:  No  Homicidal Thoughts:  No  Memory:  Immediate;   Good Recent;   Good Remote;   Good  Judgement:  Intact  Insight:  Fair  Psychomotor Activity:  Decreased  Concentration:  Good  Recall:  Good  Fund of Knowledge:Good  Language: Good  Akathisia:  Negative  Handed:  Right  AIMS (if indicated):     Assets:  Desire for Improvement Resilience  Sleep:  Number of Hours: 6.75  Cognition: WNL  ADL's:  Intact   Mental Status Per Nursing Assessment::   On Admission:  Suicidal ideation indicated by patient, Suicidal ideation indicated by others, Self-harm behaviors, Intention to act on suicide plan, Suicide plan  Demographic Factors:  NA  Loss Factors: Loss of significant relationship  Historical Factors: Impulsivity  Risk Reduction Factors:   Living with  another person, especially a relative and Positive social support  Continued Clinical Symptoms:  Depression:   Impulsivity  Cognitive Features That Contribute To Risk:  Thought constriction (tunnel vision)    Suicide Risk:  Minimal: No identifiable suicidal ideation.  Patients presenting with no risk factors but with morbid ruminations; may be classified as minimal risk based on the severity of the depressive symptoms   Follow-up Information    [facility] Go on 06/29/2020.   Why: You have an appointment for assessment and individual therapy on 06/29/20 at 2:30 pm, and on 07/21/20 at 1:30 pm for medication management.  These appointments will be held in person.  After your initial appointment, provider will set up group therapy. Contact information: [address] [place] [phone]               Plan Of Care/Follow-up recommendations:  Activity:  ad lib  [phone], MD 06/24/2020, 4:41 PM

## 2020-06-24 NOTE — Progress Notes (Signed)
Pt has been visible in the day room at the beginning of the shift. Pt attend wrap group, reported good day, good appetite. Denied SI/HI and contracted fro safety. Observed in bed resting with even and unlabored respiration, will continue to monitor.

## 2020-06-24 NOTE — BHH Counselor (Signed)
CSW spoke with pt about sending additional referrals to [facility] and [facility] in [place]. She is agreeable. Pt also states she would likely discharge to her parents home if there is not a door to door transition.   CSW received email from [facility] explaining that she received referral forms but may not have a admission slot available until 07/05/20.   CSW called Marylene Land [phone]) at [facility] in [place]. She provides fax number to send referral. CSW faxed clinicals to [phone]  CSW called and left message to [facility] and faxed records to  [phone].   CSW spoke with pt's father and discussed referral options. He agrees with referrals to [facility] and [facility]. CSW informs father that pt will likely d/c on 06/25/20. He acknowledges this and does state that pt will come home with him and his wife upon d/c. He explains weapons have been removed from home and have no safety concerns. States he is retired and would be available at all times.

## 2020-06-24 NOTE — Progress Notes (Signed)
D: Patient denies SI/HI/AVH. Patient rated anxiety 7/10 and depression 6/10. Patient out in open areas and was social with staff and peers. A:  Patient took scheduled medicine.  Support and encouragement provided Routine safety checks conducted every 15 minutes. Patient  Informed to notify staff with any concerns.  R:  Safety maintained.

## 2020-06-24 NOTE — Discharge Summary (Signed)
Physician Discharge Summary Note  Patient:  Claire Adams is an 29 y.o., female MRN:  [id number] DOB:  1990/10/10 Patient phone:  [phone] (home)  Patient address:   [address] [place] [id number],  Total Time spent with patient: 45 minutes  Date of Admission:  06/19/2020 Date of Discharge: 06/25/2020  Reason for Admission:  This is the first inpatient psychiatric admission for this 29 year old Caucasian female with prior hx of major depression & anxiety disorder. [facility] with complaint of worsening symptoms of depression & intentional suicide attempt by overdose on two handful of klonopin 0.5 mg tablets. She was apparently was taken to the ED by her parents & husband. After evaluation at the ED & medical stabilization, [facility] was [facility] for further evaluation & treatment.  Principal Problem: MDD (major depressive disorder), recurrent episode, severe (HCC) Discharge Diagnoses: Principal Problem:   MDD (major depressive disorder), recurrent episode, severe (HCC) Active Problems:   Anxiety disorder, unspecified   Suicide attempt by benzodiazepine overdose (HCC)   Major depressive disorder, recurrent episode, severe (HCC)   Insomnia   Past Psychiatric History: Major depression, Anxiety disorder  Past Medical History:  Past Medical History:  Diagnosis Date  . Broken arm 2017   Right elbow per pt  . 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 Ovarian cyst     Past Surgical History:  Procedure Laterality Date  . bilateral ovarian cystectomy Bilateral 2019  . broke left arm Left 2006  . broke right arm  2018  . LAPAROSCOPIC OVARIAN CYSTECTOMY Bilateral   . severe ligament tear right foot    . WISDOM TOOTH EXTRACTION     Family History:  Family History  Problem Relation Age of Onset  . Healthy Mother   . Arthritis Mother   . Healthy Father   . Hypertension Father   .  Arthritis Maternal Grandmother   . Stroke Paternal Grandmother    Family Psychiatric  History: Depression/Anxiety disorders: Mother.                                                       Completed suicide: Paternal uncle. Social History:  Social History   Substance and Sexual Activity  Alcohol Use Yes     Social History   Substance and Sexual Activity  Drug Use Never    Social History   Socioeconomic History  . Marital status: Married    Spouse name: Not on file  . Number of children: Not on file  . Years of education: Not on file  . Highest education level: Not on file  Occupational History  . Not on file  Tobacco Use  . Smoking status: Never Smoker  . Smokeless tobacco: Never Used  Vaping Use  . Vaping Use: Never used  Substance and Sexual Activity  . Alcohol use: Yes  . Drug use: Never  . Sexual activity: Yes    Birth control/protection: Injection  Other Topics Concern  . Not on file  Social History Narrative  . Not on file   Social Determinants of Health   Financial Resource Strain:   . Difficulty of Paying Living Expenses: Not on file  Food Insecurity:   .  Worried About Programme researcher, broadcasting/film/video in the Last Year: Not on file  . Ran Out of Food in the Last Year: Not on file  Transportation Needs:   . Lack of Transportation (Medical): Not on file  . Lack of Transportation (Non-Medical): Not on file  Physical Activity:   . Days of Exercise per Week: Not on file  . Minutes of Exercise per Session: Not on file  Stress:   . Feeling of Stress : Not on file  Social Connections:   . Frequency of Communication with Friends and Family: Not on file  . Frequency of Social Gatherings with Friends and Family: Not on file  . Attends Religious Services: Not on file  . Active Member of Clubs or Organizations: Not on file  . Attends Banker Meetings: Not on file  . Marital Status: Not on file    Hospital Course:  Patient was started on Cymbalta 30 mg and  Risperidone 1 mg for augmentation and she was tapered off Zoloft.  Her Cymbalta was increased to 60 mg and risperidone was stopped because of ongoing suicidal ideations. She was provided with Vistaril 25 mg  Po Q4 h PRN and Trazodone 50 mg Po QHS PRN. She was on CIWA for Klonopin overdose. She was encouraged to express about the reasons for her low self esteem.  She was provides with support and encouraged to attend group therapies. Family meeting with social worker, patient & husband occurred on 06/22/2020 and options were provided for partial hospitalizations, intensive outpatient therapies and residential's. Her Zoloft was tapered off, her sluggishness got improved and Cymbalta 60 mg was continued.  She got accepted at [facility] residential and door to door was done on 06/25/2020 at 6 am. Her prescriptions were called in [place]. She denies suicidal ideations, looks depressed and anxious so, educated more on [place] ( showed their videos and pictures) and that made patient comfortable.  Discharge medications:  1. Cymbalta 60 mg 2. Vistaril 25 mg 3. Trazodone 50 mg  Physical Findings: AIMS: Facial and Oral Movements Muscles of Facial Expression: None, normal Lips and Perioral Area: None, normal Jaw: None, normal Tongue: None, normal,Extremity Movements Upper (arms, wrists, hands, fingers): None, normal Lower (legs, knees, ankles, toes): None, normal, Trunk Movements Neck, shoulders, hips: None, normal, Overall Severity Severity of abnormal movements (highest score from questions above): None, normal Incapacitation due to abnormal movements: None, normal Patient's awareness of abnormal movements (rate only patient's report): No Awareness, Dental Status Current problems with teeth and/or dentures?: No Does patient usually wear dentures?: No  CIWA:  CIWA-Ar Total: 2 COWS:     Musculoskeletal: Strength & Muscle Tone: within normal limits Gait & Station: normal Patient leans:  N/A  Psychiatric Specialty Exam: Physical Exam  Review of Systems  Blood pressure 95/67, pulse 88, temperature 98.5 F (36.9 C), temperature source Oral, resp. rate 16, height 5\' 8"  (1.727 m), weight 67.6 kg, SpO2 98 %.Body mass index is 22.66 kg/m.  General Appearance: Casual  Eye Contact:  Good  Speech:  Normal Rate  Volume:  Decreased  Mood:  Dysphoric  Affect:  Congruent  Thought Process:  Linear and Descriptions of Associations: Intact  Orientation:  Full (Time, Place, and Person)  Thought Content:  Logical  Suicidal Thoughts:  No  Homicidal Thoughts:  No  Memory:  Immediate;   Good Recent;   Good Remote;   Good  Judgement:  Fair  Insight:  Fair  Psychomotor Activity:  Decreased  Concentration:  Concentration: Good and Attention Span: Good  Recall:  Good  Fund of Knowledge:  Good  Language:  Good  Akathisia:  Negative  Handed:  Right  AIMS (if indicated):     Assets:  Communication Skills Desire for Improvement Financial Resources/Insurance Housing Intimacy Resilience Social Support Talents/Skills Vocational/Educational  ADL's:  Intact  Cognition:  WNL  Sleep:  Number of Hours: 5.25     Have you used any form of tobacco in the last 30 days? (Cigarettes, Smokeless Tobacco, Cigars, and/or Pipes): No  Has this patient used any form of tobacco in the last 30 days? (Cigarettes, Smokeless Tobacco, Cigars, and/or Pipes) Yes, N/A  Blood Alcohol level:  Lab Results  Component Value Date   ETH <10 06/19/2020    Metabolic Disorder Labs:  Lab Results  Component Value Date   HGBA1C 5.1 06/21/2020   MPG 99.67 06/21/2020   Lab Results  Component Value Date   PROLACTIN 48.0 (H) 06/21/2020   Lab Results  Component Value Date   CHOL 192 06/21/2020   TRIG 52 06/21/2020   HDL 64 06/21/2020   CHOLHDL 3.0 06/21/2020   VLDL 10 06/21/2020   LDLCALC 118 (H) 06/21/2020   LDLCALC 65 10/28/2018    See Psychiatric Specialty Exam and Suicide Risk Assessment  completed by Attending Physician prior to discharge.  Discharge destination:  Other:  [facility]  Is patient on multiple antipsychotic therapies at discharge:  No   Has Patient had three or more failed trials of antipsychotic monotherapy by history:  No  Recommended Plan for Multiple Antipsychotic Therapies: NA  Discharge Instructions    Diet - low sodium heart healthy   Complete by: As directed    Discharge patient   Complete by: As directed    Discharge disposition: 02-Transferred to [facility]   Discharge patient date: 06/24/2020   Increase activity slowly   Complete by: As directed      Allergies as of 06/25/2020   No Known Allergies     Medication List    STOP taking these medications   clonazePAM 0.5 MG tablet Commonly known as: KLONOPIN   sertraline 100 MG tablet Commonly known as: ZOLOFT     TAKE these medications     Indication  DULoxetine 60 MG capsule Commonly known as: CYMBALTA Take 1 capsule (60 mg total) by mouth daily.  Indication: Generalized Anxiety Disorder, Major Depressive Disorder   hydrOXYzine 25 MG tablet Commonly known as: ATARAX/VISTARIL Take 1 tablet (25 mg total) by mouth every 4 (four) hours as needed for anxiety (Sleep).  Indication: Feeling Anxious   Incassia 0.35 MG tablet Generic drug: norethindrone Take 1 tablet by mouth daily.  Indication: Birth Control Treatment   traZODone 50 MG tablet Commonly known as: DESYREL Take 1 tablet (50 mg total) by mouth at bedtime as needed for sleep.  Indication: Trouble Sleeping       Follow-up Information    [facility] Go on 07/27/2020.   Why: You have an appointment for assessment and medication management services 07/27/20 at 12:00 pm.  This appointment will be held in person.  This provider also has group therapy services available. Contact information: [address] [place] [address] [phone]        [facility]. Go to.   Why: Door to door transition for residential MI treatment.  Contact information: [address]  [place], [place] [address] [phone]  Follow-up recommendations:  Activity:  As tolearted Diet:  Low sodium heart diet  Comments:  Prescriptions given at discharge.Patient agreeable to plan. Given opportunity to ask questions. Appears to feel comfortable with discharge denies any current suicidal or homicidal thought. Patient is also instructed prior to discharge to: Take all medications as prescribed by his/her mental healthcare provider. Report any adverse effects and or reactions from the medicines to her outpatient provider promptly. Patient has been instructed & cautioned: To not engage in alcohol and or illegal drug use while on prescription medicines. In the event of worsening symptoms, patient is instructed to call the crisis hotline, 911 and or go to the nearest ED for appropriate evaluation and treatment of symptoms. To follow-up with her primary care provider for your other medical issues, concerns and or health care needs.  Signed: Arnoldo Lenis, MD 06/25/2020, 9:51 AM

## 2020-06-24 NOTE — BHH Counselor (Signed)
CSW received call from [place] at [facility]. Claire Adams inquired about potential purging after meals. CSW consulted with nurse and MD. CSW informed Claire Adams that pt has not been purging and has been eating well.   Claire Adams confirmed pt would be able to have a bed at [facility] but would need to be admitted by 12pm. Pt will need to be discharged at 6am to drive 5 hours to [place]. Pt will need new covid test. Pt also completed prescreen with Claire Adams. [facility] would require patient to come with medication.   CSW called pt father and confirmed plan for D/C at 6am. MD will send scripts to [facility] in [facility] and father will pickup meds prior to d/c.   CSW also called pt husband and confirmed plan for d/c at 6am.

## 2020-06-24 NOTE — BHH Group Notes (Signed)
Occupational Therapy Group Note Date: 06/24/2020 Group Topic/Focus: Self-Esteem  Group Description: Group encouraged increased engagement and participation through discussion and activity focused on self-esteem. Patients explored and discussed the differences between healthy and low self-esteem and how it affects our daily lives and occupations with a focus on relationships, work, school, self-care, and personal leisure interests. Group discussion then transitioned into identifying specific strategies to boost self-esteem and engaged in a collaborative and independent activity looking at positive ways to describe oneself A-Z.  Participation Level: Active   Participation Quality: Independent   Behavior: Calm, Cooperative and Interactive   Speech/Thought Process: Focused   Affect/Mood: Euthymic   Insight: Moderate   Judgement: Moderate   Individualization: Andie was active and independent in her participation of activity and group discussion. She shared that she has periods of low self-esteem and identified "not comparing myself to others" as a strategy to boost her self-esteem. Pt appeared pleasant, cooperative, and engaged for duration.   Modes of Intervention: Activity, Discussion, Education and Socialization  Patient Response to Interventions:  Attentive, Engaged and Receptive   Plan: Continue to engage patient in OT groups 2 - 3x/week.  06/24/2020  Donne Hazel, MOT, OTR/L

## 2020-06-25 DIAGNOSIS — F331 Major depressive disorder, recurrent, moderate: Secondary | ICD-10-CM | POA: Diagnosis not present

## 2020-06-25 DIAGNOSIS — F411 Generalized anxiety disorder: Secondary | ICD-10-CM | POA: Diagnosis not present

## 2020-06-25 DIAGNOSIS — F332 Major depressive disorder, recurrent severe without psychotic features: Secondary | ICD-10-CM | POA: Diagnosis not present

## 2020-06-25 NOTE — Progress Notes (Signed)
   06/25/20 0548  Psych Admission Type (Psych Patients Only)  Admission Status Voluntary  Psychosocial Assessment  Patient Complaints Anxiety  Eye Contact Fair  Facial Expression Other (Comment) (sleepy-just woke up)  Affect Appropriate to circumstance  Speech Logical/coherent  Interaction Assertive  Motor Activity Other (Comment) (wnl)  Appearance/Hygiene Unremarkable  Behavior Characteristics Cooperative;Anxious  Mood Pleasant;Anxious  Thought Process  Coherency WDL  Content WDL  Delusions None reported or observed  Perception WDL  Hallucination None reported or observed  Judgment WDL  Confusion None  Danger to Self  Current suicidal ideation? Denies  Danger to Others  Danger to Others None reported or observed   Pt woken up at 0520 to get ready to be discharged at 0600. Pt denies SI, HI, AVH and pain. Pt endorses anxiety 5/10 d/t waking up around 0200 and thinking that it was time for her to leave already. Pt given Vistaril 25 mg.

## 2020-06-25 NOTE — Progress Notes (Signed)
Nurs Dischg Note:   D:Patient denies SI/HI at this time. Pt appears calm and cooperative, and no distress noted.    A: All Personal items in locker 38 returned to pt. Pt escorted to lobby to wait for ride to [facility] in [place].    R:  Pt states she will comply with outpatient services, and take MEDS as prescribed.

## 2020-06-25 NOTE — Progress Notes (Signed)
  [facility] Adult Case Management Discharge Plan :  Will you be returning to the same living situation after discharge:  No. Transition to residential tx At discharge, do you have transportation home?: Yes,  father and husband Do you have the ability to pay for your medications: Yes,  BCBS  Release of information consent forms completed and in the chart;  Patient's signature needed at discharge.  Patient to Follow up at:  Follow-up Information    [facility] Go on 06/29/2020.   Why: You have an appointment for assessment and individual therapy on 06/29/20 at 2:30 pm, and on 07/21/20 at 1:30 pm for medication management.  These appointments will be held in person.  After your initial appointment, provider will set up group therapy. Contact information: [address] [place] [address] [phone]        [facility]. Go to.   Why: Door to door transition for residential MI treatment.  Contact information: [address] [place] [address] [phone]              Next level of care provider has access to [facility] Link:no  Safety Planning and Suicide Prevention discussed: Yes,  with father and husband  Have you used any form of tobacco in the last 30 days? (Cigarettes, Smokeless Tobacco, Cigars, and/or Pipes): No  Has patient been referred to the Quitline?: N/A patient is not a smoker  Patient has been referred for addiction treatment: N/A  Erin Sons, LCSW 06/25/2020, 8:21 AM

## 2020-06-28 DIAGNOSIS — F411 Generalized anxiety disorder: Secondary | ICD-10-CM | POA: Diagnosis not present

## 2020-06-28 DIAGNOSIS — F332 Major depressive disorder, recurrent severe without psychotic features: Secondary | ICD-10-CM | POA: Diagnosis not present

## 2020-07-28 DIAGNOSIS — F321 Major depressive disorder, single episode, moderate: Secondary | ICD-10-CM | POA: Diagnosis not present

## 2020-07-29 DIAGNOSIS — F321 Major depressive disorder, single episode, moderate: Secondary | ICD-10-CM | POA: Diagnosis not present

## 2020-07-30 DIAGNOSIS — F321 Major depressive disorder, single episode, moderate: Secondary | ICD-10-CM | POA: Diagnosis not present

## 2020-08-02 DIAGNOSIS — F321 Major depressive disorder, single episode, moderate: Secondary | ICD-10-CM | POA: Diagnosis not present

## 2020-08-03 DIAGNOSIS — F321 Major depressive disorder, single episode, moderate: Secondary | ICD-10-CM | POA: Diagnosis not present

## 2020-08-04 DIAGNOSIS — F321 Major depressive disorder, single episode, moderate: Secondary | ICD-10-CM | POA: Diagnosis not present

## 2020-08-09 DIAGNOSIS — F321 Major depressive disorder, single episode, moderate: Secondary | ICD-10-CM | POA: Diagnosis not present

## 2020-08-10 DIAGNOSIS — F321 Major depressive disorder, single episode, moderate: Secondary | ICD-10-CM | POA: Diagnosis not present

## 2020-08-11 DIAGNOSIS — F321 Major depressive disorder, single episode, moderate: Secondary | ICD-10-CM | POA: Diagnosis not present

## 2020-08-16 DIAGNOSIS — F321 Major depressive disorder, single episode, moderate: Secondary | ICD-10-CM | POA: Diagnosis not present

## 2020-08-17 DIAGNOSIS — F321 Major depressive disorder, single episode, moderate: Secondary | ICD-10-CM | POA: Diagnosis not present

## 2020-08-18 DIAGNOSIS — F321 Major depressive disorder, single episode, moderate: Secondary | ICD-10-CM | POA: Diagnosis not present

## 2020-08-19 DIAGNOSIS — F321 Major depressive disorder, single episode, moderate: Secondary | ICD-10-CM | POA: Diagnosis not present

## 2020-08-20 DIAGNOSIS — F321 Major depressive disorder, single episode, moderate: Secondary | ICD-10-CM | POA: Diagnosis not present

## 2020-08-23 DIAGNOSIS — F321 Major depressive disorder, single episode, moderate: Secondary | ICD-10-CM | POA: Diagnosis not present

## 2020-08-24 DIAGNOSIS — F321 Major depressive disorder, single episode, moderate: Secondary | ICD-10-CM | POA: Diagnosis not present

## 2020-08-25 DIAGNOSIS — F321 Major depressive disorder, single episode, moderate: Secondary | ICD-10-CM | POA: Diagnosis not present

## 2020-08-26 DIAGNOSIS — F321 Major depressive disorder, single episode, moderate: Secondary | ICD-10-CM | POA: Diagnosis not present

## 2020-08-27 DIAGNOSIS — F321 Major depressive disorder, single episode, moderate: Secondary | ICD-10-CM | POA: Diagnosis not present

## 2020-08-30 DIAGNOSIS — F321 Major depressive disorder, single episode, moderate: Secondary | ICD-10-CM | POA: Diagnosis not present

## 2020-08-31 DIAGNOSIS — F321 Major depressive disorder, single episode, moderate: Secondary | ICD-10-CM | POA: Diagnosis not present

## 2020-09-01 DIAGNOSIS — F321 Major depressive disorder, single episode, moderate: Secondary | ICD-10-CM | POA: Diagnosis not present

## 2020-09-06 DIAGNOSIS — F321 Major depressive disorder, single episode, moderate: Secondary | ICD-10-CM | POA: Diagnosis not present

## 2020-09-07 DIAGNOSIS — F321 Major depressive disorder, single episode, moderate: Secondary | ICD-10-CM | POA: Diagnosis not present

## 2020-09-08 DIAGNOSIS — F321 Major depressive disorder, single episode, moderate: Secondary | ICD-10-CM | POA: Diagnosis not present

## 2020-09-13 DIAGNOSIS — F321 Major depressive disorder, single episode, moderate: Secondary | ICD-10-CM | POA: Diagnosis not present

## 2020-09-14 DIAGNOSIS — F321 Major depressive disorder, single episode, moderate: Secondary | ICD-10-CM | POA: Diagnosis not present

## 2020-09-15 DIAGNOSIS — F321 Major depressive disorder, single episode, moderate: Secondary | ICD-10-CM | POA: Diagnosis not present

## 2020-09-20 DIAGNOSIS — F321 Major depressive disorder, single episode, moderate: Secondary | ICD-10-CM | POA: Diagnosis not present

## 2020-09-21 DIAGNOSIS — F321 Major depressive disorder, single episode, moderate: Secondary | ICD-10-CM | POA: Diagnosis not present

## 2020-09-22 DIAGNOSIS — F321 Major depressive disorder, single episode, moderate: Secondary | ICD-10-CM | POA: Diagnosis not present

## 2020-09-27 DIAGNOSIS — F321 Major depressive disorder, single episode, moderate: Secondary | ICD-10-CM | POA: Diagnosis not present

## 2020-09-28 DIAGNOSIS — F321 Major depressive disorder, single episode, moderate: Secondary | ICD-10-CM | POA: Diagnosis not present

## 2020-09-29 DIAGNOSIS — F321 Major depressive disorder, single episode, moderate: Secondary | ICD-10-CM | POA: Diagnosis not present

## 2020-10-04 DIAGNOSIS — F321 Major depressive disorder, single episode, moderate: Secondary | ICD-10-CM | POA: Diagnosis not present

## 2020-10-09 DIAGNOSIS — F603 Borderline personality disorder: Secondary | ICD-10-CM | POA: Insufficient documentation

## 2020-10-11 DIAGNOSIS — F339 Major depressive disorder, recurrent, unspecified: Secondary | ICD-10-CM | POA: Diagnosis not present

## 2020-10-11 DIAGNOSIS — F4310 Post-traumatic stress disorder, unspecified: Secondary | ICD-10-CM | POA: Diagnosis not present

## 2020-10-13 DIAGNOSIS — F4310 Post-traumatic stress disorder, unspecified: Secondary | ICD-10-CM | POA: Diagnosis not present

## 2020-10-13 DIAGNOSIS — F339 Major depressive disorder, recurrent, unspecified: Secondary | ICD-10-CM | POA: Diagnosis not present

## 2020-10-18 DIAGNOSIS — F4310 Post-traumatic stress disorder, unspecified: Secondary | ICD-10-CM | POA: Diagnosis not present

## 2020-10-18 DIAGNOSIS — F339 Major depressive disorder, recurrent, unspecified: Secondary | ICD-10-CM | POA: Diagnosis not present

## 2020-10-25 DIAGNOSIS — F339 Major depressive disorder, recurrent, unspecified: Secondary | ICD-10-CM | POA: Diagnosis not present

## 2020-10-25 DIAGNOSIS — F4310 Post-traumatic stress disorder, unspecified: Secondary | ICD-10-CM | POA: Diagnosis not present

## 2020-11-10 DIAGNOSIS — F4310 Post-traumatic stress disorder, unspecified: Secondary | ICD-10-CM | POA: Diagnosis not present

## 2020-11-10 DIAGNOSIS — F339 Major depressive disorder, recurrent, unspecified: Secondary | ICD-10-CM | POA: Diagnosis not present

## 2020-11-29 DIAGNOSIS — N760 Acute vaginitis: Secondary | ICD-10-CM | POA: Diagnosis not present

## 2020-11-29 DIAGNOSIS — R309 Painful micturition, unspecified: Secondary | ICD-10-CM | POA: Diagnosis not present

## 2020-11-29 DIAGNOSIS — Z113 Encounter for screening for infections with a predominantly sexual mode of transmission: Secondary | ICD-10-CM | POA: Diagnosis not present

## 2020-12-14 DIAGNOSIS — F4310 Post-traumatic stress disorder, unspecified: Secondary | ICD-10-CM | POA: Diagnosis not present

## 2020-12-14 DIAGNOSIS — F339 Major depressive disorder, recurrent, unspecified: Secondary | ICD-10-CM | POA: Diagnosis not present

## 2021-01-08 ENCOUNTER — Other Ambulatory Visit (HOSPITAL_COMMUNITY): Payer: Self-pay | Admitting: Psychiatry

## 2021-02-09 DIAGNOSIS — Z6825 Body mass index (BMI) 25.0-25.9, adult: Secondary | ICD-10-CM | POA: Diagnosis not present

## 2021-02-09 DIAGNOSIS — Z01419 Encounter for gynecological examination (general) (routine) without abnormal findings: Secondary | ICD-10-CM | POA: Diagnosis not present

## 2021-02-09 LAB — HM PAP SMEAR: HM Pap smear: NEGATIVE

## 2021-04-06 DIAGNOSIS — F4310 Post-traumatic stress disorder, unspecified: Secondary | ICD-10-CM | POA: Diagnosis not present

## 2021-04-06 DIAGNOSIS — F339 Major depressive disorder, recurrent, unspecified: Secondary | ICD-10-CM | POA: Diagnosis not present

## 2021-10-09 DIAGNOSIS — F3181 Bipolar II disorder: Secondary | ICD-10-CM | POA: Insufficient documentation

## 2022-01-15 DIAGNOSIS — A071 Giardiasis [lambliasis]: Secondary | ICD-10-CM | POA: Insufficient documentation

## 2022-02-16 ENCOUNTER — Other Ambulatory Visit (HOSPITAL_COMMUNITY): Payer: Self-pay | Admitting: Psychiatry

## 2022-02-16 DIAGNOSIS — F332 Major depressive disorder, recurrent severe without psychotic features: Secondary | ICD-10-CM

## 2022-06-06 ENCOUNTER — Encounter (HOSPITAL_BASED_OUTPATIENT_CLINIC_OR_DEPARTMENT_OTHER): Payer: Self-pay | Admitting: Family Medicine

## 2022-06-06 ENCOUNTER — Ambulatory Visit (INDEPENDENT_AMBULATORY_CARE_PROVIDER_SITE_OTHER): Payer: 59 | Admitting: Family Medicine

## 2022-06-06 VITALS — BP 102/77 | HR 72 | Temp 97.6°F | Ht 68.0 in | Wt 204.4 lb

## 2022-06-06 DIAGNOSIS — K529 Noninfective gastroenteritis and colitis, unspecified: Secondary | ICD-10-CM | POA: Diagnosis not present

## 2022-06-06 DIAGNOSIS — Z0000 Encounter for general adult medical examination without abnormal findings: Secondary | ICD-10-CM

## 2022-06-06 DIAGNOSIS — F332 Major depressive disorder, recurrent severe without psychotic features: Secondary | ICD-10-CM | POA: Diagnosis not present

## 2022-06-06 NOTE — Progress Notes (Signed)
New Patient Office Visit  Subjective    Patient ID: Claire Adams, female    DOB: [birth year]  Age: 31 y.o. MRN: [id number]  CC:  Chief Complaint  Patient presents with   New Patient (Initial Visit)    Pt here to establish new care, she also stated she has some concerns she would like to go over with     HPI Ezequiel Kayser presents to establish care Last PCP - Dr. Lenord Fellers - last visit about 2 years ago  Anxiety/depression: Currently following with Tomi Bamberger. Has been seeing her for about 1 year, maybe longer. Current medications include Lexapro, aripiprazole, hydroxyzine, trazodone. The aripiprazole was recently added. Had been taking Wellbutrin previously, weaned off this about 2 months ago.  History of abdominal pain/epigastric pain: Has been following with Dr. Kinnie Scales. Last office visit about 2 years ago - did have a phone encounter earlier this year with questions about some symptoms of epigastric pain, abdominal pain. No medications being taken currently, will utilize omeprazole occasionally.  Does follow with OB/GYN. Currently taking oral contraceptive. No concerns today. Last visit was about 2 months ago.  At the end of visit, patient also indicates that she has been having chronic diarrhea.  On review of chart it does appear that this has been something she has dealt with in the past and has seen GI for.  However, she reports that current issue began about 4 months ago with intermittent diarrhea, greasy stools.  She is concerned about Giardia.  Reports that she interacted with a puppy who was diagnosed with Giardia and thinks that this is where she got it from.  She has had associated intermittent abdominal discomfort.  No fever, chills, sweats.  Patient is originally from [place]. 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 She enjoys doing puzzles, dogs.  Outpatient Encounter Medications as of 06/06/2022  Medication Sig   ARIPiprazole (ABILIFY) 2  MG tablet Take by oral route for 30 days.   hydrOXYzine (VISTARIL) 25 MG capsule Take 25 mg by mouth every 6 (six) hours as needed.   norethindrone (MICRONOR) 0.35 MG tablet Take 1 tablet by mouth daily.   traZODone (DESYREL) 50 MG tablet Take 1 tablet (50 mg total) by mouth at bedtime as needed for sleep.   [DISCONTINUED] DULoxetine (CYMBALTA) 60 MG capsule Take 1 capsule (60 mg total) by mouth daily.   [DISCONTINUED] INCASSIA 0.35 MG tablet Take 1 tablet by mouth daily.   No facility-administered encounter medications on file as of 06/06/2022.    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   Pt states only one occurance   Ovarian cyst     Past Surgical History:  Procedure Laterality Date   bilateral ovarian cystectomy Bilateral 2019   broke left arm Left 2006   broke right arm  2018   LAPAROSCOPIC OVARIAN CYSTECTOMY Bilateral    severe ligament tear right foot     WISDOM TOOTH EXTRACTION      Family History  Problem Relation Age of Onset   Healthy Mother    Arthritis Mother    Healthy Father    Hypertension Father    Arthritis Maternal Grandmother    Stroke Paternal Grandmother     Social History   Socioeconomic History   Marital status: Married    Spouse name: Not on file   Number of children: Not on  file   Years of education: Not on file   Highest education level: Not on file  Occupational History   Not on file  Tobacco Use   Smoking status: Never   Smokeless tobacco: Never  Vaping Use   Vaping Use: Never used  Substance and Sexual Activity   Alcohol use: Yes   Drug use: Never   Sexual activity: Yes    Birth control/protection: Injection  Other Topics Concern   Not on file  Social History Narrative   Not on file   Social Determinants of Health   Financial Resource Strain: Not on file  Food Insecurity: Not on file  Transportation Needs: Not on file  Physical Activity: Not on  file  Stress: Not on file  Social Connections: Not on file  Intimate Partner Violence: Not on file    Objective    BP 102/77   Pulse 72   Temp 97.6 F (36.4 C) (Oral)   Ht 5\' 8"  (1.727 m)   Wt 204 lb 6.4 oz (92.7 kg)   SpO2 98%   BMI 31.08 kg/m   Physical Exam  31 year old female in no acute distress Cardiovascular exam with regular rate and rhythm, no murmur appreciated Lungs clear to auscultation bilaterally  Assessment & Plan:   Problem List Items Addressed This Visit       Digestive   Chronic diarrhea    Chronic intermittent issue for patient, however most recent episode began about 4 months ago.  She specifically is concerned about Giardia.  Given duration of symptoms, feel that proceeding with stool studies would be reasonable as well as some blood work which will be done today.  She was provided instructions on how to collect stool sample and return that to the lab for further testing.  She has seen GI provider in the past which I feel would be reasonable for her to follow-up with them.  Certainly once stool studies are received, we will see if any specific treatment or further testing that may be needed and likely proceed with referral back to GI for continued evaluation and management      Relevant Orders   CBC with Differential/Platelet   Comprehensive metabolic panel   TSH Rfx on Abnormal to Free T4   Glia (IgA/G) + tTG IgA   IgA   Giardia antigen   Ova and parasite examination   Fecal lactoferrin, quant   Calprotectin, Fecal   C-reactive protein     Other   Major depressive disorder, recurrent episode, severe (HCC) - Primary    Medication list updated today, currently taking aripiprazole, hydroxyzine, trazodone, Lexapro.  Recommend continuing to follow-up with psychiatrist locally for continued medication management as well as any counseling through their office      Relevant Medications   hydrOXYzine (VISTARIL) 25 MG capsule   Other Visit Diagnoses      Wellness examination       Relevant Orders   CBC with Differential/Platelet   Comprehensive metabolic panel   Hemoglobin A1c   Lipid panel   TSH Rfx on Abnormal to Free T4      Spent 48 minutes on this patient encounter, including preparation, chart review, face-to-face counseling with patient and coordination of care, and documentation of encounter  Return in about 6 weeks (around 07/18/2022) for CPE.   Geoffry Bannister J De 09/17/2022, MD

## 2022-06-06 NOTE — Assessment & Plan Note (Signed)
Chronic intermittent issue for patient, however most recent episode began about 4 months ago.  She specifically is concerned about Giardia.  Given duration of symptoms, feel that proceeding with stool studies would be reasonable as well as some blood work which will be done today.  She was provided instructions on how to collect stool sample and return that to the lab for further testing.  She has seen GI provider in the past which I feel would be reasonable for her to follow-up with them.  Certainly once stool studies are received, we will see if any specific treatment or further testing that may be needed and likely proceed with referral back to GI for continued evaluation and management

## 2022-06-06 NOTE — Assessment & Plan Note (Signed)
Medication list updated today, currently taking aripiprazole, hydroxyzine, trazodone, Lexapro.  Recommend continuing to follow-up with psychiatrist locally for continued medication management as well as any counseling through their office

## 2022-06-06 NOTE — Patient Instructions (Signed)
  Medication Instructions:  Your physician recommends that you continue on your current medications as directed. Please refer to the Current Medication list given to you today. --If you need a refill on any your medications before your next appointment, please call your pharmacy first. If no refills are authorized on file call the office.-- Lab Work: Your physician has recommended that you have lab work today: Yes If you have labs (blood work) drawn today and your tests are completely normal, you will receive your results via MyChart message OR a phone call from our staff.  Please ensure you check your voicemail in the event that you authorized detailed messages to be left on a delegated number. If you have any lab test that is abnormal or we need to change your treatment, we will call you to review the results.  Referrals/Procedures/Imaging: No  Follow-Up: Your next appointment:   Your physician recommends that you schedule a follow-up appointment in 1-2 months cpe with Dr. de [place].  You will receive a text message or e-mail with a link to a survey about your care and experience with us today! We would greatly appreciate your feedback!   Thanks for letting us be apart of your health journey!!  Primary Care and Sports Medicine   Dr. Raymond de [place]   We encourage you to activate your patient portal called "MyChart".  Sign up information is provided on this After Visit Summary.  MyChart is used to connect with patients for Virtual Visits (Telemedicine).  Patients are able to view lab/test results, encounter notes, upcoming appointments, etc.  Non-urgent messages can be sent to your provider as well. To learn more about what you can do with MyChart, please visit --  [email or web address]    

## 2022-06-08 LAB — CBC WITH DIFFERENTIAL/PLATELET
Basophils Absolute: 0 10*3/uL (ref 0.0–0.2)
Basos: 1 %
EOS (ABSOLUTE): 0 10*3/uL (ref 0.0–0.4)
Eos: 1 %
Hematocrit: 45.1 % (ref 34.0–46.6)
Hemoglobin: 15.1 g/dL (ref 11.1–15.9)
Immature Grans (Abs): 0 10*3/uL (ref 0.0–0.1)
Immature Granulocytes: 0 %
Lymphocytes Absolute: 2.1 10*3/uL (ref 0.7–3.1)
Lymphs: 40 %
MCH: 31.9 pg (ref 26.6–33.0)
MCHC: 33.5 g/dL (ref 31.5–35.7)
MCV: 95 fL (ref 79–97)
Monocytes Absolute: 0.4 10*3/uL (ref 0.1–0.9)
Monocytes: 7 %
Neutrophils Absolute: 2.7 10*3/uL (ref 1.4–7.0)
Neutrophils: 51 %
Platelets: 278 10*3/uL (ref 150–450)
RBC: 4.74 x10E6/uL (ref 3.77–5.28)
RDW: 12.3 % (ref 11.7–15.4)
WBC: 5.3 10*3/uL (ref 3.4–10.8)

## 2022-06-08 LAB — COMPREHENSIVE METABOLIC PANEL
ALT: 31 IU/L (ref 0–32)
AST: 22 IU/L (ref 0–40)
Albumin/Globulin Ratio: 2.1 (ref 1.2–2.2)
Albumin: 4.5 g/dL (ref 4.0–5.0)
Alkaline Phosphatase: 81 IU/L (ref 44–121)
BUN/Creatinine Ratio: 20 (ref 9–23)
BUN: 16 mg/dL (ref 6–20)
Bilirubin Total: 0.6 mg/dL (ref 0.0–1.2)
CO2: 18 mmol/L — ABNORMAL LOW (ref 20–29)
Calcium: 9.4 mg/dL (ref 8.7–10.2)
Chloride: 105 mmol/L (ref 96–106)
Creatinine, Ser: 0.8 mg/dL (ref 0.57–1.00)
Globulin, Total: 2.1 g/dL (ref 1.5–4.5)
Glucose: 103 mg/dL — ABNORMAL HIGH (ref 70–99)
Potassium: 4.3 mmol/L (ref 3.5–5.2)
Sodium: 138 mmol/L (ref 134–144)
Total Protein: 6.6 g/dL (ref 6.0–8.5)
eGFR: 103 mL/min/{1.73_m2} (ref >59)

## 2022-06-08 LAB — LIPID PANEL
Chol/HDL Ratio: 4.9 ratio — ABNORMAL HIGH (ref 0.0–4.4)
Cholesterol, Total: 226 mg/dL — ABNORMAL HIGH (ref 100–199)
HDL: 46 mg/dL (ref >39)
LDL Chol Calc (NIH): 153 mg/dL — ABNORMAL HIGH (ref 0–99)
Triglycerides: 151 mg/dL — ABNORMAL HIGH (ref 0–149)
VLDL Cholesterol Cal: 27 mg/dL (ref 5–40)

## 2022-06-08 LAB — HEMOGLOBIN A1C
Est. average glucose Bld gHb Est-mCnc: 108 mg/dL
Hgb A1c MFr Bld: 5.4 % (ref 4.8–5.6)

## 2022-06-08 LAB — C-REACTIVE PROTEIN: CRP: <1 mg/L (ref 0–10)

## 2022-06-08 LAB — GLIA (IGA/G) + TTG IGA
Antigliadin Abs, IgA: 15 units (ref 0–19)
Gliadin IgG: 2 units (ref 0–19)
Transglutaminase IgA: <2 U/mL (ref 0–3)

## 2022-06-08 LAB — IGA: IgA/Immunoglobulin A, Serum: 95 mg/dL (ref 87–352)

## 2022-06-08 LAB — TSH RFX ON ABNORMAL TO FREE T4: TSH: 1.1 u[IU]/mL (ref 0.450–4.500)

## 2022-06-13 LAB — FECAL LACTOFERRIN, QUANT: Lactoferrin, Fecal, Quant.: 8.05 ug/mL(g) — ABNORMAL HIGH (ref 0.00–7.24)

## 2022-06-21 ENCOUNTER — Other Ambulatory Visit (HOSPITAL_BASED_OUTPATIENT_CLINIC_OR_DEPARTMENT_OTHER): Payer: Self-pay | Admitting: Family Medicine

## 2022-06-21 DIAGNOSIS — K529 Noninfective gastroenteritis and colitis, unspecified: Secondary | ICD-10-CM

## 2022-07-18 ENCOUNTER — Encounter (HOSPITAL_BASED_OUTPATIENT_CLINIC_OR_DEPARTMENT_OTHER): Payer: Self-pay | Admitting: Family Medicine

## 2022-07-18 ENCOUNTER — Ambulatory Visit (INDEPENDENT_AMBULATORY_CARE_PROVIDER_SITE_OTHER): Payer: 59 | Admitting: Family Medicine

## 2022-07-18 VITALS — BP 115/77 | HR 75 | Temp 97.7°F | Ht 68.0 in | Wt 212.0 lb

## 2022-07-18 DIAGNOSIS — K529 Noninfective gastroenteritis and colitis, unspecified: Secondary | ICD-10-CM | POA: Diagnosis not present

## 2022-07-18 DIAGNOSIS — Z0000 Encounter for general adult medical examination without abnormal findings: Secondary | ICD-10-CM | POA: Diagnosis not present

## 2022-07-18 DIAGNOSIS — Z23 Encounter for immunization: Secondary | ICD-10-CM | POA: Diagnosis not present

## 2022-07-18 NOTE — Patient Instructions (Signed)
  Medication Instructions:  Your physician recommends that you continue on your current medications as directed. Please refer to the Current Medication list given to you today. --If you need a refill on any your medications before your next appointment, please call your pharmacy first. If no refills are authorized on file call the office.-- Lab Work: Your physician has recommended that you have lab work today: No If you have labs (blood work) drawn today and your tests are completely normal, you will receive your results via [place] a phone call from our staff.  Please ensure you check your voicemail in the event that you authorized detailed messages to be left on a delegated number. If you have any lab test that is abnormal or we need to change your treatment, we will call you to review the results.  Referrals/Procedures/Imaging: GI  Follow-Up: Your next appointment:   Your physician recommends that you schedule a follow-up appointment in: 1 year cpe with Dr. de [place].  You will receive a text message or e-mail with a link to a survey about your care and experience with [place] today! We would greatly appreciate your feedback!   Thanks for letting us be apart of your health journey!!  Primary Care and Sports Medicine   Dr. Arlina Robes [place]   We encourage you to activate your patient portal called "MyChart".  Sign up information is provided on this After Visit Summary.  MyChart is used to connect with patients for Virtual Visits (Telemedicine).  Patients are able to view lab/test results, encounter notes, upcoming appointments, etc.  Non-urgent messages can be sent to your provider as well. To learn more about what you can do with MyChart, please visit --  [email or web address].

## 2022-07-18 NOTE — Progress Notes (Signed)
Subjective:    CC: Annual Physical Exam  HPI:  Claire Adams is a 31 y.o. presenting for annual physical  I reviewed the past medical history, family history, social history, surgical history, and allergies today and no changes were needed.  Please see the problem list section below in epic for further details.  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   Pt states only one occurance   Ovarian cyst    Past Surgical History: Past Surgical History:  Procedure Laterality Date   bilateral ovarian cystectomy Bilateral 2019   broke left arm Left 2006   broke right arm  2018   LAPAROSCOPIC OVARIAN CYSTECTOMY Bilateral    severe ligament tear right foot     WISDOM TOOTH EXTRACTION     Social History: Social History   Socioeconomic History   Marital status: Married    Spouse name: Not on file   Number of children: Not on file   Years of education: Not on file   Highest education level: Not on file  Occupational History   Not on file  Tobacco Use   Smoking status: Never   Smokeless tobacco: Never  Vaping Use   Vaping Use: Never used  Substance and Sexual Activity   Alcohol use: Yes   Drug use: Never   Sexual activity: Yes    Birth control/protection: Injection  Other Topics Concern   Not on file  Social History Narrative   Not on file   Social Determinants of Health   Financial Resource Strain: Not on file  Food Insecurity: Not on file  Transportation Needs: Not on file  Physical Activity: Not on file  Stress: Not on file  Social Connections: Not on file   Family History: Family History  Problem Relation Age of Onset   Healthy Mother    Arthritis Mother    Healthy Father    Hypertension Father    Arthritis Maternal Grandmother    Stroke Paternal Grandmother    Allergies: No Known Allergies Medications: See med rec.  Review of Systems: No headache,  visual changes, nausea, vomiting, diarrhea, constipation, dizziness, abdominal pain, skin rash, fevers, chills, night sweats, swollen lymph nodes, weight loss, chest pain, body aches, joint swelling, muscle aches, shortness of breath, mood changes, visual or auditory hallucinations.  Objective:    BP 115/77   Pulse 75   Temp 97.7 F (36.5 C) (Oral)   Ht 5\' 8"  (1.727 m)   Wt 212 lb (96.2 kg)   SpO2 99%   BMI 32.23 kg/m   General: Well Developed, well nourished, and in no acute distress. Neuro: Alert and oriented x3, extra-ocular muscles intact, sensation grossly intact. Cranial nerves II through XII are intact, motor, sensory, and coordinative functions are all intact. HEENT: Normocephalic, atraumatic, pupils equal round reactive to light, neck supple, no masses, no lymphadenopathy, thyroid nonpalpable. Oropharynx, nasopharynx, external ear canals are unremarkable. Skin: Warm and dry, no rashes noted. Cardiac: Regular rate and rhythm, no murmurs rubs or gallops. Respiratory: Clear to auscultation bilaterally. Not using accessory muscles, speaking in full sentences. Abdominal: Soft, nontender, nondistended, positive bowel sounds, no masses, no organomegaly. Musculoskeletal: Shoulder, elbow, wrist, hip, knee, ankle stable, and with full range of motion.  Impression and Recommendations:    Wellness examination Routine HCM labs reviewed. HCM reviewed/discussed. Anticipatory guidance regarding healthy weight, lifestyle and choices given. Recommend healthy diet.  Recommend approximately 150 minutes/week of moderate intensity exercise Recommend regular dental and vision exams Always use seatbelt/lap and shoulder restraints Recommend using smoke alarms and checking batteries at least twice a year Recommend using sunscreen when outside Discussed tetanus immunization recommendations, patient is UTD Recommend seasonal flu vaccine, patient amenable, administered today  Chronic diarrhea Recent  stool testing showed elevated fecal lactoferrin.  Discussed that this can be a sign of intestinal inflammation and warrants further evaluation with GI.  Referral to GI was previously placed, patient provided with contact information for GI office today so she may reach out to them and schedule appointment  Return in about 1 year (around 07/19/2023) for CPE.   ___________________________________________ Kerington Hildebrant de [place], MD, ABFM, CAQSM Primary Care and [place]

## 2022-07-18 NOTE — Assessment & Plan Note (Signed)
Routine HCM labs reviewed. HCM reviewed/discussed. Anticipatory guidance regarding healthy weight, lifestyle and choices given. Recommend healthy diet.  Recommend approximately 150 minutes/week of moderate intensity exercise Recommend regular dental and vision exams Always use seatbelt/lap and shoulder restraints Recommend using smoke alarms and checking batteries at least twice a year Recommend using sunscreen when outside Discussed tetanus immunization recommendations, patient is UTD Recommend seasonal flu vaccine, patient amenable, administered today

## 2022-07-18 NOTE — Assessment & Plan Note (Signed)
Recent stool testing showed elevated fecal lactoferrin.  Discussed that this can be a sign of intestinal inflammation and warrants further evaluation with GI.  Referral to GI was previously placed, patient provided with contact information for GI office today so she may reach out to them and schedule appointment

## 2022-08-30 ENCOUNTER — Other Ambulatory Visit: Payer: Self-pay | Admitting: Family Medicine

## 2022-08-30 DIAGNOSIS — E049 Nontoxic goiter, unspecified: Secondary | ICD-10-CM

## 2022-09-04 ENCOUNTER — Other Ambulatory Visit: Payer: 59

## 2022-09-07 ENCOUNTER — Ambulatory Visit
Admission: RE | Admit: 2022-09-07 | Discharge: 2022-09-07 | Disposition: A | Discharge status: Home / Self Care | Payer: 59 | Source: Ambulatory Visit | Attending: Family Medicine | Admitting: Family Medicine

## 2022-09-07 DIAGNOSIS — E049 Nontoxic goiter, unspecified: Secondary | ICD-10-CM

## 2022-10-12 ENCOUNTER — Other Ambulatory Visit: Payer: Self-pay | Admitting: Family Medicine

## 2022-10-12 DIAGNOSIS — R1011 Right upper quadrant pain: Secondary | ICD-10-CM

## 2023-01-04 ENCOUNTER — Encounter (HOSPITAL_BASED_OUTPATIENT_CLINIC_OR_DEPARTMENT_OTHER): Payer: Self-pay | Admitting: Family Medicine

## 2023-01-04 ENCOUNTER — Ambulatory Visit (HOSPITAL_BASED_OUTPATIENT_CLINIC_OR_DEPARTMENT_OTHER): Payer: 59 | Admitting: Family Medicine

## 2023-01-04 VITALS — BP 121/80 | HR 77 | Ht 68.0 in | Wt 206.0 lb

## 2023-01-04 DIAGNOSIS — R112 Nausea with vomiting, unspecified: Secondary | ICD-10-CM | POA: Insufficient documentation

## 2023-01-04 MED ORDER — ONDANSETRON 4 MG PO TBDP: 4.0000 mg | ORAL_TABLET | Freq: Three times a day (TID) | ORAL | 1 refills | Status: DC | PRN

## 2023-01-04 NOTE — Assessment & Plan Note (Addendum)
About 2-3 weeks, began to experience nausea, then began to develop vomiting shortly thereafter, reporting that she has not been able to keep anything down for a week or more. She indicates a few months ago she started Lamictal. She has concern as she found out that it reduces effectiveness of her OCP and has been experiencing spotting, acne, breast tenderness. She also indicates some abdominal discomfort, occasional diarrhea. No specific alleviating factors or aggravating factors.  She was previously referred to gastroenterology, unfortunately has not been able to establish yet. On exam, patient is in no acute distress, vital signs stable.  Cardiovascular exam with regular rate and rhythm, lungs clear to auscultation bilaterally.  Abdomen is soft, mild discomfort along bilateral flanks, no organomegaly.  Normal bowel sounds. Uncertain etiology of current symptoms.  Recommend proceeding with laboratory assessment given reported consistent nausea and vomiting with inability to tolerate much p.o. intake.  However, vital signs are stable with normal blood pressure, normal pulse rate which would suggest no significant dehydration at this time.  Will proceed with labs initially We also provided patient with contact information for GI office so that she may contact them to schedule appointment Will allow for use of Zofran as needed to help with controlling nausea and vomiting She did complete home UPT, however would prefer to have serum based assessment of hCG which is reasonable, this will be checked as well

## 2023-01-04 NOTE — Progress Notes (Signed)
    Procedures performed today:    None.  Independent interpretation of notes and tests performed by another provider:   None.  Brief History, Exam, Impression, and Recommendations:    BP 121/80 (BP Location: Left Arm, Patient Position: Sitting, Cuff Size: Large)   Pulse 77   Ht 5\' 8"  (1.727 m)   Wt 206 lb (93.4 kg)   SpO2 96%   BMI 31.32 kg/m   Nausea and vomiting About 2-3 weeks, began to experience nausea, then began to develop vomiting shortly thereafter, reporting that she has not been able to keep anything down for a week or more. She indicates a few months ago she started Lamictal. She has concern as she found out that it reduces effectiveness of her OCP and has been experiencing spotting, acne, breast tenderness. She also indicates some abdominal discomfort, occasional diarrhea. No specific alleviating factors or aggravating factors.  She was previously referred to gastroenterology, unfortunately has not been able to establish yet. On exam, patient is in no acute distress, vital signs stable.  Cardiovascular exam with regular rate and rhythm, lungs clear to auscultation bilaterally.  Abdomen is soft, mild discomfort along bilateral flanks, no organomegaly.  Normal bowel sounds. Uncertain etiology of current symptoms.  Recommend proceeding with laboratory assessment given reported consistent nausea and vomiting with inability to tolerate much p.o. intake.  However, vital signs are stable with normal blood pressure, normal pulse rate which would suggest no significant dehydration at this time.  Will proceed with labs initially We also provided patient with contact information for GI office so that she may contact them to schedule appointment Will allow for use of Zofran as needed to help with controlling nausea and vomiting She did complete home UPT, however would prefer to have serum based assessment of hCG which is reasonable, this will be checked as  well   ___________________________________________ Marilou Barnfield de [place], MD, ABFM, CAQSM Primary Care and [place]

## 2023-01-05 LAB — COMPREHENSIVE METABOLIC PANEL
ALT: 24 IU/L (ref 0–32)
AST: 14 IU/L (ref 0–40)
Albumin/Globulin Ratio: 2.4 — ABNORMAL HIGH (ref 1.2–2.2)
Albumin: 4.8 g/dL (ref 4.0–5.0)
Alkaline Phosphatase: 94 IU/L (ref 44–121)
BUN/Creatinine Ratio: 19 (ref 9–23)
BUN: 15 mg/dL (ref 6–20)
Bilirubin Total: 0.7 mg/dL (ref 0.0–1.2)
CO2: 18 mmol/L — ABNORMAL LOW (ref 20–29)
Calcium: 9.8 mg/dL (ref 8.7–10.2)
Chloride: 104 mmol/L (ref 96–106)
Creatinine, Ser: 0.78 mg/dL (ref 0.57–1.00)
Globulin, Total: 2 g/dL (ref 1.5–4.5)
Glucose: 102 mg/dL — ABNORMAL HIGH (ref 70–99)
Potassium: 4.2 mmol/L (ref 3.5–5.2)
Sodium: 141 mmol/L (ref 134–144)
Total Protein: 6.8 g/dL (ref 6.0–8.5)
eGFR: 105 mL/min/{1.73_m2} (ref >59)

## 2023-01-05 LAB — CBC WITH DIFFERENTIAL/PLATELET
Basophils Absolute: 0 10*3/uL (ref 0.0–0.2)
Basos: 1 %
EOS (ABSOLUTE): 0 10*3/uL (ref 0.0–0.4)
Eos: 1 %
Hematocrit: 49 % — ABNORMAL HIGH (ref 34.0–46.6)
Hemoglobin: 16.4 g/dL — ABNORMAL HIGH (ref 11.1–15.9)
Immature Grans (Abs): 0 10*3/uL (ref 0.0–0.1)
Immature Granulocytes: 0 %
Lymphocytes Absolute: 1.9 10*3/uL (ref 0.7–3.1)
Lymphs: 33 %
MCH: 32.4 pg (ref 26.6–33.0)
MCHC: 33.5 g/dL (ref 31.5–35.7)
MCV: 97 fL (ref 79–97)
Monocytes Absolute: 0.5 10*3/uL (ref 0.1–0.9)
Monocytes: 10 %
Neutrophils Absolute: 3.2 10*3/uL (ref 1.4–7.0)
Neutrophils: 55 %
Platelets: 315 10*3/uL (ref 150–450)
RBC: 5.06 x10E6/uL (ref 3.77–5.28)
RDW: 12.3 % (ref 11.7–15.4)
WBC: 5.6 10*3/uL (ref 3.4–10.8)

## 2023-01-05 LAB — TSH RFX ON ABNORMAL TO FREE T4: TSH: 1.78 u[IU]/mL (ref 0.450–4.500)

## 2023-01-05 LAB — BETA HCG QUANT (REF LAB): hCG Quant: <1 m[IU]/mL

## 2023-02-18 ENCOUNTER — Other Ambulatory Visit: Payer: Self-pay

## 2023-02-18 ENCOUNTER — Emergency Department (HOSPITAL_BASED_OUTPATIENT_CLINIC_OR_DEPARTMENT_OTHER): Payer: 59

## 2023-02-18 ENCOUNTER — Encounter (HOSPITAL_BASED_OUTPATIENT_CLINIC_OR_DEPARTMENT_OTHER): Payer: Self-pay | Admitting: Emergency Medicine

## 2023-02-18 ENCOUNTER — Emergency Department (HOSPITAL_BASED_OUTPATIENT_CLINIC_OR_DEPARTMENT_OTHER)
Admission: EM | Admit: 2023-02-18 | Discharge: 2023-02-18 | Disposition: A | Discharge status: Home / Self Care | Payer: 59 | Attending: Emergency Medicine | Admitting: Emergency Medicine

## 2023-02-18 DIAGNOSIS — S61401A Unspecified open wound of right hand, initial encounter: Secondary | ICD-10-CM | POA: Insufficient documentation

## 2023-02-18 DIAGNOSIS — Z23 Encounter for immunization: Secondary | ICD-10-CM | POA: Diagnosis not present

## 2023-02-18 DIAGNOSIS — S6991XA Unspecified injury of right wrist, hand and finger(s), initial encounter: Secondary | ICD-10-CM | POA: Diagnosis present

## 2023-02-18 DIAGNOSIS — S61402A Unspecified open wound of left hand, initial encounter: Secondary | ICD-10-CM | POA: Insufficient documentation

## 2023-02-18 DIAGNOSIS — W5501XA Bitten by cat, initial encounter: Secondary | ICD-10-CM | POA: Diagnosis not present

## 2023-02-18 MED ORDER — TETANUS-DIPHTH-ACELL PERTUSSIS 5-2.5-18.5 LF-MCG/0.5 IM SUSY
0.5000 mL | PREFILLED_SYRINGE | Freq: Once | INTRAMUSCULAR | Status: AC
  Administered 2023-02-18: 0.5 mL via INTRAMUSCULAR
  Filled 2023-02-18: qty 0.5

## 2023-02-18 MED ORDER — AMOXICILLIN-POT CLAVULANATE 875-125 MG PO TABS: 1.0000 | ORAL_TABLET | Freq: Two times a day (BID) | ORAL | 0 refills | Status: AC

## 2023-02-18 MED ORDER — AMOXICILLIN-POT CLAVULANATE 875-125 MG PO TABS
1.0000 | ORAL_TABLET | Freq: Once | ORAL | Status: AC
  Administered 2023-02-18: 1 via ORAL
  Filled 2023-02-18: qty 1

## 2023-02-18 NOTE — Discharge Instructions (Addendum)
It was a pleasure caring for you today. Xray was without concern for foreign body. You received a tetanus booster and one dose of you antibiotic course. I have sent a prescription for your other antibiotics to your pharmacy. Please take entire course of antibiotics. Seek emergency care if experiencing any new or worsening symptoms such as fevers, severe pain, or difficulties moving hand.

## 2023-02-18 NOTE — ED Triage Notes (Signed)
Pt was bitten by 2 cats on BUE ; one cat is UTD on vaccines, the other is 75 wks old

## 2023-02-18 NOTE — ED Provider Notes (Signed)
[facility] Provider Note   CSN: [id number] Arrival date & time: 02/18/23  1732     History  Chief Complaint  Patient presents with   Animal Bite    Claire Adams is a 32 y.o. female who presents to ED after cat bite. Patient was trying to prevent her friend's cat from running outside, grabbed that cat, and was bit multiple times. Patient also endorses that her 43 week old cat bit her last night after it was frightened. 30 week old cat is healthy, but has not received rabies vaccinations yet. Patient unsure of last tetanus. Denies bilateral hand weakness/paresthesias, fevers.   Animal Bite      Home Medications Prior to Admission medications   Medication Sig Start Date End Date Taking? Authorizing Provider  buPROPion (WELLBUTRIN XL) 150 MG 24 hr tablet TAKE 1 TABLET BY MOUTH EVERY DAY IN THE MORNING    [provider]  hydrOXYzine (VISTARIL) 25 MG capsule Take 25 mg by mouth every 6 (six) hours as needed. 05/23/22   [provider]  norethindrone (MICRONOR) 0.35 MG tablet Take 1 tablet by mouth daily.    [provider]  ondansetron (ZOFRAN-ODT) 4 MG disintegrating tablet Take 1 tablet (4 mg total) by mouth every 8 (eight) hours as needed for nausea or vomiting. 01/04/23   de [place], Buren Kos, MD  REXULTI 1 MG TABS tablet Take 1 mg by mouth at bedtime. 07/03/22   [provider]  REXULTI 2 MG TABS tablet Take 2 mg by mouth at bedtime. 07/14/22   [provider]  traZODone (DESYREL) 50 MG tablet Take 1 tablet (50 mg total) by mouth at bedtime as needed for sleep. 06/24/20   Dagar, Geralynn Rile, MD      Allergies    Patient has no known allergies.    Review of Systems   Review of Systems  Skin:  Positive for wound.    Physical Exam Updated Vital Signs BP 107/76 (BP Location: Right Arm)   Pulse 91   Temp (!) 97.5 F (36.4 C)   Resp 18   Ht 5\' 8"  (1.727 m)   Wt 93.4 kg   SpO2 97%   BMI  31.31 kg/m  Physical Exam Vitals and nursing note reviewed.  Constitutional:      General: She is not in acute distress.    Appearance: She is not ill-appearing or toxic-appearing.  HENT:     Head: Normocephalic and atraumatic.  Eyes:     General: No scleral icterus.       Right eye: No discharge.        Left eye: No discharge.     Conjunctiva/sclera: Conjunctivae normal.  Cardiovascular:     Rate and Rhythm: Normal rate.  Pulmonary:     Effort: Pulmonary effort is normal.  Abdominal:     General: Abdomen is flat.  Musculoskeletal:        General: No swelling, tenderness or deformity. Normal range of motion.     Comments: Full active ROM of fingers and wrist without pain. Small scattered wounds without erythema, swelling, warmth, or purulence. Bilateral hands neurovascularly intact. No weakness.   Skin:    General: Skin is warm and dry.     Capillary Refill: Capillary refill takes less than 2 seconds.     Findings: No erythema.  Neurological:     General: No focal deficit present.     Mental Status: She is alert. Mental status  is at baseline.     Sensory: No sensory deficit.     Motor: No weakness.  Psychiatric:        Mood and Affect: Mood normal.        Behavior: Behavior normal.     ED Results / Procedures / Treatments   Labs (all labs ordered are listed, but only abnormal results are displayed) Labs Reviewed - No data to display  EKG None  Radiology No results found.  Procedures Procedures    Medications Ordered in ED Medications  Tdap (BOOSTRIX) injection 0.5 mL (0.5 mLs Intramuscular Given 02/18/23 1828)  amoxicillin-clavulanate (AUGMENTIN) 875-125 MG per tablet 1 tablet (1 tablet Oral Given 02/18/23 1828)    ED Course/ Medical Decision Making/ A&P                             Medical Decision Making Risk Prescription drug management.   This patient presents to the ED for concern of cat bite, this involves an extensive number of treatment  options, and is a complaint that carries with it a high risk of complications and morbidity.  The differential diagnosis includes cellulitis, abscess, rabies infection   Co morbidities that complicate the patient evaluation  none    Imaging Studies ordered:  I ordered imaging studies including  -Bilateral hand xray: no foreign body I independently visualized and interpreted imaging I agree with the radiologist interpretation    Problem List / ED Course / Critical interventions / Medication management  Patient presenting to ED after cat bite. Friend's indoor cat bit patient multiple times today. Patient's 67 week old seemingly healthy kitten who she found near a dumpster 1 week ago bit her last night. Patient without fever, and other vital signs are stable. Wounds appear small, scattered and superficial without erythema/purulence/swelling. Wounds do not  appear deep and there is no neurovascular compromise on physical exam. Patient does not remember last time receiving tetanus vaccine. Patient denies past history of immune/lymphatic compromise. Patient educated on the need of a prophylactic antibiotic which I have prescribed. Educated patient that I was planning on activating animal control to quarantine the 29 week old kitten without rabies vaccinations. Patient requested to not contact animal control, stating that kitten already has been going to vet for vaccinations and vet will check up on rabies status. Cat lives indoors now.  Educated patient on the risks of not following through with rabies status and symptoms of rabies. Patient endorsed understanding. Patient receiving tetanus dose today along with one dose of ABX in ED. Xray without concern for foreign bodies. Prescribing outpatient ABX course. I have reviewed the patients home medicines and have made adjustments as needed Patient was given return precautions. Patient stable for discharge at this time. Patient verbalized understanding of  plan.   Social Determinants of Health:  none           Final Clinical Impression(s) / ED Diagnoses Final diagnoses:  Cat bite, initial encounter    Rx / DC Orders ED Discharge Orders     None         Margarita Rana 02/18/23 Windell Moment    Alvira Monday, MD 02/19/23 1306

## 2023-03-26 DIAGNOSIS — R519 Headache, unspecified: Secondary | ICD-10-CM | POA: Insufficient documentation

## 2023-07-03 ENCOUNTER — Ambulatory Visit (HOSPITAL_BASED_OUTPATIENT_CLINIC_OR_DEPARTMENT_OTHER): Payer: 59 | Admitting: Family Medicine

## 2023-07-19 ENCOUNTER — Ambulatory Visit (HOSPITAL_BASED_OUTPATIENT_CLINIC_OR_DEPARTMENT_OTHER): Payer: 59 | Admitting: Family Medicine

## 2023-07-19 ENCOUNTER — Encounter (HOSPITAL_BASED_OUTPATIENT_CLINIC_OR_DEPARTMENT_OTHER): Payer: Self-pay | Admitting: Family Medicine

## 2023-07-19 ENCOUNTER — Encounter (HOSPITAL_BASED_OUTPATIENT_CLINIC_OR_DEPARTMENT_OTHER): Payer: Self-pay | Admitting: *Deleted

## 2023-07-19 VITALS — BP 114/84 | HR 70 | Ht 68.5 in | Wt 190.0 lb

## 2023-07-19 DIAGNOSIS — Z0000 Encounter for general adult medical examination without abnormal findings: Secondary | ICD-10-CM | POA: Diagnosis not present

## 2023-07-19 DIAGNOSIS — E559 Vitamin D deficiency, unspecified: Secondary | ICD-10-CM | POA: Diagnosis not present

## 2023-07-19 DIAGNOSIS — Z23 Encounter for immunization: Secondary | ICD-10-CM | POA: Diagnosis not present

## 2023-07-19 NOTE — Progress Notes (Signed)
Subjective:    CC: Annual Physical Exam  HPI: Claire Adams is a 32 y.o. presenting for annual physical  I reviewed the past medical history, family history, social history, surgical history, and allergies today and no changes were needed.  Please see the problem list section below in epic for further details.  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   Pt states only one occurance   Ovarian cyst    Past Surgical History: Past Surgical History:  Procedure Laterality Date   bilateral ovarian cystectomy Bilateral 2019   broke left arm Left 2006   broke right arm  2018   LAPAROSCOPIC OVARIAN CYSTECTOMY Bilateral    severe ligament tear right foot     WISDOM TOOTH EXTRACTION     Social History: Social History   Socioeconomic History   Marital status: Married    Spouse name: Not on file   Number of children: Not on file   Years of education: Not on file   Highest education level: Bachelor's degree (e.g., BA, AB, BS)  Occupational History   Not on file  Tobacco Use   Smoking status: Never   Smokeless tobacco: Never  Vaping Use   Vaping status: Never Used  Substance and Sexual Activity   Alcohol use: Yes   Drug use: Never   Sexual activity: Yes    Birth control/protection: Injection  Other Topics Concern   Not on file  Social History Narrative   Not on file   Social Determinants of Health   Financial Resource Strain: Low Risk  (01/03/2023)   Overall Financial Resource Strain (CARDIA)    Difficulty of Paying Living Expenses: Not hard at all  Food Insecurity: No Food Insecurity (01/03/2023)   Hunger Vital Sign    Worried About Running Out of Food in the Last Year: Never true    Ran Out of Food in the Last Year: Never true  Transportation Needs: No Transportation Needs (01/03/2023)   PRAPARE - Administrator, Civil Service (Medical): No    Lack of  Transportation (Non-Medical): No  Physical Activity: Sufficiently Active (01/03/2023)   Exercise Vital Sign    Days of Exercise per Week: 5 days    Minutes of Exercise per Session: 60 min  Stress: Stress Concern Present (01/03/2023)   [employer] of Occupational Health - Occupational Stress Questionnaire    Feeling of Stress : To some extent  Social Connections: Moderately Isolated (01/03/2023)   Social Connection and Isolation Panel [NHANES]    Frequency of Communication with Friends and Family: More than three times a week    Frequency of Social Gatherings with Friends and Family: Three times a week    Attends Religious Services: Never    Active Member of Clubs or Organizations: No    Attends Engineer, structural: Not on file    Marital Status: Married   Family History: Family History  Problem Relation Age of Onset   Healthy Mother    Arthritis Mother    Healthy Father    Hypertension Father    Arthritis Maternal Grandmother    Stroke Paternal Grandmother    Allergies: No Known Allergies Medications: See med rec.  Review of Systems: No headache, visual changes, nausea, vomiting, diarrhea, constipation, dizziness, abdominal pain, skin rash, fevers, chills, night sweats, swollen lymph nodes, weight loss, chest pain, body aches, joint  swelling, muscle aches, shortness of breath, mood changes, visual or auditory hallucinations.  Objective:    BP 114/84 (BP Location: Left Arm, Patient Position: Sitting, Cuff Size: Normal)   Pulse 70   Ht 5' 8.5" (1.74 m)   Wt 190 lb (86.2 kg)   SpO2 98%   BMI 28.47 kg/m   General: Well Developed, well nourished, and in no acute distress. Neuro: Alert and oriented x3, extra-ocular muscles intact, sensation grossly intact. Cranial nerves II through XII are intact, motor, sensory, and coordinative functions are all intact. HEENT: Normocephalic, atraumatic, pupils equal round reactive to light, neck supple, no masses, no  lymphadenopathy, thyroid nonpalpable. Oropharynx, nasopharynx, external ear canals are unremarkable. Skin: Warm and dry, no rashes noted. Cardiac: Regular rate and rhythm, no murmurs rubs or gallops. Respiratory: Clear to auscultation bilaterally. Not using accessory muscles, speaking in full sentences. Abdominal: Soft, nontender, nondistended, positive bowel sounds, no masses, no organomegaly. Musculoskeletal: Shoulder, elbow, wrist, hip, knee, ankle stable, and with full range of motion.  Impression and Recommendations:    Wellness examination Assessment & Plan: Routine HCM labs ordered. HCM reviewed/discussed. Anticipatory guidance regarding healthy weight, lifestyle and choices given. Recommend healthy diet.  Recommend approximately 150 minutes/week of moderate intensity exercise Recommend regular dental and vision exams Always use seatbelt/lap and shoulder restraints Recommend using smoke alarms and checking batteries at least twice a year Recommend using sunscreen when outside Discussed tetanus immunization recommendations, patient is UTD Recommend seasonal flu vaccine, patient amenable, administered today Has had cervical cancer screening with GYN, will request records  Orders: -     CBC with Differential/Platelet; Future -     Comprehensive metabolic panel; Future -     Lipid panel; Future -     TSH Rfx on Abnormal to Free T4; Future -     Hemoglobin A1c; Future -     VITAMIN D 25 Hydroxy (Vit-D Deficiency, Fractures); Future  Encounter for immunization -     Flu vaccine trivalent PF, 6mos and older(Flulaval,Afluria,Fluarix,Fluzone)  Vitamin D deficiency Assessment & Plan: About 6 months ago had labs drawn outside of [place].  On review of these, she did have low vitamin D level.  She has been taking vitamin D supplement, unsure of dose. Will plan to recheck vitamin D level with upcoming labs with recommendations for change in supplementation based on  results  Orders: -     VITAMIN D 25 Hydroxy (Vit-D Deficiency, Fractures); Future  Return in about 1 year (around 07/18/2024) for CPE.   ___________________________________________ Amrit Erck de [place], MD, ABFM, [facility] Primary Care and Sports Medicine [facility]

## 2023-07-19 NOTE — Assessment & Plan Note (Signed)
About 6 months ago had labs drawn outside of [place].  On review of these, she did have low vitamin D level.  She has been taking vitamin D supplement, unsure of dose. Will plan to recheck vitamin D level with upcoming labs with recommendations for change in supplementation based on results

## 2023-07-19 NOTE — Assessment & Plan Note (Addendum)
Routine HCM labs ordered. HCM reviewed/discussed. Anticipatory guidance regarding healthy weight, lifestyle and choices given. Recommend healthy diet.  Recommend approximately 150 minutes/week of moderate intensity exercise Recommend regular dental and vision exams Always use seatbelt/lap and shoulder restraints Recommend using smoke alarms and checking batteries at least twice a year Recommend using sunscreen when outside Discussed tetanus immunization recommendations, patient is UTD Recommend seasonal flu vaccine, patient amenable, administered today Has had cervical cancer screening with GYN, will request records

## 2023-07-24 ENCOUNTER — Encounter (HOSPITAL_BASED_OUTPATIENT_CLINIC_OR_DEPARTMENT_OTHER): Payer: Self-pay | Admitting: *Deleted

## 2023-07-26 ENCOUNTER — Other Ambulatory Visit (HOSPITAL_BASED_OUTPATIENT_CLINIC_OR_DEPARTMENT_OTHER): Payer: 59

## 2023-07-27 ENCOUNTER — Other Ambulatory Visit (HOSPITAL_BASED_OUTPATIENT_CLINIC_OR_DEPARTMENT_OTHER): Payer: Self-pay | Admitting: Family Medicine

## 2023-07-28 LAB — CBC WITH DIFFERENTIAL/PLATELET
Basophils Absolute: 0 10*3/uL (ref 0.0–0.2)
Basos: 0 %
EOS (ABSOLUTE): 0.1 10*3/uL (ref 0.0–0.4)
Eos: 1 %
Hematocrit: 44.2 % (ref 34.0–46.6)
Hemoglobin: 14.4 g/dL (ref 11.1–15.9)
Immature Grans (Abs): 0 10*3/uL (ref 0.0–0.1)
Immature Granulocytes: 0 %
Lymphocytes Absolute: 2.3 10*3/uL (ref 0.7–3.1)
Lymphs: 41 %
MCH: 32 pg (ref 26.6–33.0)
MCHC: 32.6 g/dL (ref 31.5–35.7)
MCV: 98 fL — ABNORMAL HIGH (ref 79–97)
Monocytes Absolute: 0.4 10*3/uL (ref 0.1–0.9)
Monocytes: 8 %
Neutrophils Absolute: 2.8 10*3/uL (ref 1.4–7.0)
Neutrophils: 50 %
Platelets: 289 10*3/uL (ref 150–450)
RBC: 4.5 x10E6/uL (ref 3.77–5.28)
RDW: 12 % (ref 11.7–15.4)
WBC: 5.6 10*3/uL (ref 3.4–10.8)

## 2023-07-28 LAB — HEMOGLOBIN A1C
Est. average glucose Bld gHb Est-mCnc: 108 mg/dL
Hgb A1c MFr Bld: 5.4 % (ref 4.8–5.6)

## 2023-07-28 LAB — COMPREHENSIVE METABOLIC PANEL
ALT: 10 [IU]/L (ref 0–32)
AST: 16 [IU]/L (ref 0–40)
Albumin: 4.5 g/dL (ref 4.0–5.0)
Alkaline Phosphatase: 58 [IU]/L (ref 44–121)
BUN/Creatinine Ratio: 25 — ABNORMAL HIGH (ref 9–23)
BUN: 19 mg/dL (ref 6–20)
Bilirubin Total: 0.7 mg/dL (ref 0.0–1.2)
CO2: 22 mmol/L (ref 20–29)
Calcium: 9.3 mg/dL (ref 8.7–10.2)
Chloride: 104 mmol/L (ref 96–106)
Creatinine, Ser: 0.76 mg/dL (ref 0.57–1.00)
Globulin, Total: 2.1 g/dL (ref 1.5–4.5)
Glucose: 98 mg/dL (ref 70–99)
Potassium: 4.1 mmol/L (ref 3.5–5.2)
Sodium: 139 mmol/L (ref 134–144)
Total Protein: 6.6 g/dL (ref 6.0–8.5)
eGFR: 108 mL/min/{1.73_m2} (ref >59)

## 2023-07-28 LAB — LIPID PANEL
Chol/HDL Ratio: 4.8 {ratio} — ABNORMAL HIGH (ref 0.0–4.4)
Cholesterol, Total: 215 mg/dL — ABNORMAL HIGH (ref 100–199)
HDL: 45 mg/dL (ref >39)
LDL Chol Calc (NIH): 158 mg/dL — ABNORMAL HIGH (ref 0–99)
Triglycerides: 69 mg/dL (ref 0–149)
VLDL Cholesterol Cal: 12 mg/dL (ref 5–40)

## 2023-07-28 LAB — TSH RFX ON ABNORMAL TO FREE T4: TSH: 1.32 u[IU]/mL (ref 0.450–4.500)

## 2023-07-28 LAB — VITAMIN D 25 HYDROXY (VIT D DEFICIENCY, FRACTURES): Vit D, 25-Hydroxy: 53.4 ng/mL (ref 30.0–100.0)

## 2023-11-28 ENCOUNTER — Encounter (HOSPITAL_BASED_OUTPATIENT_CLINIC_OR_DEPARTMENT_OTHER): Payer: Self-pay | Admitting: Family Medicine

## 2023-11-28 DIAGNOSIS — E559 Vitamin D deficiency, unspecified: Secondary | ICD-10-CM

## 2023-11-28 DIAGNOSIS — E785 Hyperlipidemia, unspecified: Secondary | ICD-10-CM

## 2023-11-28 NOTE — Telephone Encounter (Signed)
 Please see mychart sent by pt and advise.

## 2023-12-07 ENCOUNTER — Other Ambulatory Visit (HOSPITAL_BASED_OUTPATIENT_CLINIC_OR_DEPARTMENT_OTHER): Payer: Self-pay | Admitting: Family Medicine

## 2023-12-08 LAB — VITAMIN D 25 HYDROXY (VIT D DEFICIENCY, FRACTURES): Vit D, 25-Hydroxy: 39.7 ng/mL (ref 30.0–100.0)

## 2023-12-08 LAB — LIPID PANEL
Chol/HDL Ratio: 4.4 ratio (ref 0.0–4.4)
Cholesterol, Total: 196 mg/dL (ref 100–199)
HDL: 45 mg/dL (ref >39)
LDL Chol Calc (NIH): 141 mg/dL — ABNORMAL HIGH (ref 0–99)
Triglycerides: 55 mg/dL (ref 0–149)
VLDL Cholesterol Cal: 10 mg/dL (ref 5–40)

## 2023-12-10 ENCOUNTER — Encounter (HOSPITAL_BASED_OUTPATIENT_CLINIC_OR_DEPARTMENT_OTHER): Payer: Self-pay | Admitting: Family Medicine

## 2024-06-11 ENCOUNTER — Encounter (HOSPITAL_BASED_OUTPATIENT_CLINIC_OR_DEPARTMENT_OTHER): Payer: Self-pay | Admitting: Family Medicine

## 2024-06-11 ENCOUNTER — Ambulatory Visit (HOSPITAL_BASED_OUTPATIENT_CLINIC_OR_DEPARTMENT_OTHER): Admitting: Family Medicine

## 2024-06-11 VITALS — BP 117/83 | HR 68 | Ht 68.5 in | Wt 171.3 lb

## 2024-06-11 DIAGNOSIS — R109 Unspecified abdominal pain: Secondary | ICD-10-CM | POA: Diagnosis not present

## 2024-06-11 DIAGNOSIS — R61 Generalized hyperhidrosis: Secondary | ICD-10-CM | POA: Diagnosis not present

## 2024-06-11 DIAGNOSIS — R198 Other specified symptoms and signs involving the digestive system and abdomen: Secondary | ICD-10-CM | POA: Diagnosis not present

## 2024-06-11 DIAGNOSIS — Z23 Encounter for immunization: Secondary | ICD-10-CM

## 2024-06-11 NOTE — Assessment & Plan Note (Signed)
 Persistent diffuse abdominal pain and altered bowel habits for over a month. Differential includes gallbladder issues, intestinal problems, or ovarian cysts. Previous ovarian cysts noted in 2019. Slight weight loss. Magnesium  supplement may contribute to symptoms. - Order CBC, electrolytes, kidney function, liver function, and thyroid  function tests. - Order abdominal ultrasound at [facility]. - Evaluate ultrasound results to determine need for further imaging, such as transvaginal ultrasound. - Consider referral to [facility] based on lab and ultrasound results.

## 2024-06-11 NOTE — Progress Notes (Signed)
 Procedures performed today:    None.  Independent interpretation of notes and tests performed by another provider:   None.  Brief History, Exam, Impression, and Recommendations:    BP 117/83 (BP Location: Right Arm, Patient Position: Sitting, Cuff Size: Normal)   Pulse 68   Ht 5' 8.5 (1.74 m)   Wt 171 lb 4.8 oz (77.7 kg)   SpO2 99%   BMI 25.67 kg/m   Discussed the use of AI scribe software for clinical note transcription with the patient, who gave verbal consent to proceed.  History of Present Illness Claire Adams is a 33 year old female who presents with persistent bowel changes and sleep disturbances.  She has been experiencing unusual bowel movements for over a month. Initially, these were painful, but the pain has subsided in the past week or two. She describes the bowel changes as 'very strange' and denies blood in her stool. There are no specific food triggers identified.  She has difficulty sleeping due to feeling 'freezing cold' at night, despite not having a fever. She recalls experiencing similar symptoms in 2019 when she had ovarian cysts, one of which was abscessed, but blood work at that time showed no abnormalities. No daytime sweats or fever.  She experiences occasional abdominal pain, which she finds difficult to attribute to either uterine or intestinal causes. Her menstrual cycle has been irregular; she believes this may be related to her IUD, which has been in place for a year. She recently had cervicitis, which her gynecologist told her has cleared up.  No significant weight changes, but she notes a slight weight loss despite poor dietary habits over the past month. She has a history of significant weight gain and subsequent loss related to mental health medication adjustments.  She experiences nausea at night, which she associates with taking magnesium  supplements to aid sleep. She has been using magnesium  for over six months.  Her psychiatrist  suggested that her mood fluctuations might be related to thyroid  issues.  She has a persistent white coating on her tongue, present since her teenage years, which she can partially remove with a tongue scraper. No pain or irritation associated with this condition.  On exam, patient is in no acute distress, vital signs stable.  Cardiovascular exam with regular rate and rhythm, lungs clear to auscultation bilaterally.  Abdomen with normal bowel sounds, soft, nontender, nondistended.  Change in bowel movement Assessment & Plan: Persistent diffuse abdominal pain and altered bowel habits for over a month. Differential includes gallbladder issues, intestinal problems, or ovarian cysts. Previous ovarian cysts noted in 2019. Slight weight loss. Magnesium  supplement may contribute to symptoms. - Order CBC, electrolytes, kidney function, liver function, and thyroid  function tests. - Order abdominal ultrasound at [facility]. - Evaluate ultrasound results to determine need for further imaging, such as transvaginal ultrasound. - Consider referral to [facility] based on lab and ultrasound results.  Orders: -     US  Abdomen Complete; Future  Night sweats Assessment & Plan: Night sweats occur primarily at night without fever. Similar symptoms in 2019 with ovarian cysts. Thyroid  dysfunction considered due to mood changes and other symptoms. Previous labs normal, but re-evaluation warranted. - Include thyroid  function tests in the lab workup.  Orders: -     CBC with Differential/Platelet -     Comprehensive metabolic panel with GFR -     TSH Rfx on Abnormal to Free T4 -     US  Abdomen Complete; Future  Abdominal pain, unspecified abdominal location -     US  Abdomen Complete; Future  Encounter for immunization -     Flu vaccine trivalent PF, 6mos and older(Flulaval,Afluria,Fluarix,Fluzone)  Tongue coating White coating on tongue since adolescence. No pain or irritation. Dentist unconcerned.  Unlikely fungal. - Recommend discussing with dentist at next appointment for further evaluation.  Return if symptoms worsen or fail to improve.  Spent 33 minutes on this patient encounter, including preparation, chart review, face-to-face counseling with patient and coordination of care, and documentation of encounter   ___________________________________________ Mehki Klumpp de [place], MD, ABFM, [facility] Primary Care and Sports Medicine [facility]

## 2024-06-11 NOTE — Patient Instructions (Addendum)
  Medication Instructions:  Your physician recommends that you continue on your current medications as directed. Please refer to the Current Medication list given to you today. --If you need a refill on any your medications before your next appointment, please call your pharmacy first. If no refills are authorized on file call the office.-- Lab Work: Your physician has recommended that you have lab work today: today If you have labs (blood work) drawn today and your tests are completely normal, you will receive your results via MyChart message OR a phone call from our staff.  Please ensure you check your voicemail in the event that you authorized detailed messages to be left on a delegated number. If you have any lab test that is abnormal or we need to change your treatment, we will call you to review the results.  Referrals/Procedures/Imaging: Ultrasound [facility] [phone]  Follow-Up: Your next appointment:   Your physician recommends that you schedule a follow-up appointment in: as needed with Dr. de [place]  You will receive a text message or e-mail with a link to a survey about your care and experience with us  today! We would greatly appreciate your feedback!   Thanks for letting us  be apart of your health journey!!  Primary Care and Sports Medicine   Dr. Quintin sheerer [place]   We encourage you to activate your patient portal called MyChart.  Sign up information is provided on this After Visit Summary.  MyChart is used to connect with patients for Virtual Visits (Telemedicine).  Patients are able to view lab/test results, encounter notes, upcoming appointments, etc.  Non-urgent messages can be sent to your provider as well. To learn more about what you can do with MyChart, please visit --  [email or web address].

## 2024-06-11 NOTE — Assessment & Plan Note (Signed)
 Night sweats occur primarily at night without fever. Similar symptoms in 2019 with ovarian cysts. Thyroid  dysfunction considered due to mood changes and other symptoms. Previous labs normal, but re-evaluation warranted. - Include thyroid  function tests in the lab workup.

## 2024-06-12 LAB — CBC WITH DIFFERENTIAL/PLATELET
Basophils Absolute: 0 x10E3/uL (ref 0.0–0.2)
Basos: 1 %
EOS (ABSOLUTE): 0.1 x10E3/uL (ref 0.0–0.4)
Eos: 1 %
Hematocrit: 44.3 % (ref 34.0–46.6)
Hemoglobin: 14.5 g/dL (ref 11.1–15.9)
Immature Grans (Abs): 0 x10E3/uL (ref 0.0–0.1)
Immature Granulocytes: 0 %
Lymphocytes Absolute: 2.4 x10E3/uL (ref 0.7–3.1)
Lymphs: 37 %
MCH: 33.3 pg — ABNORMAL HIGH (ref 26.6–33.0)
MCHC: 32.7 g/dL (ref 31.5–35.7)
MCV: 102 fL — ABNORMAL HIGH (ref 79–97)
Monocytes Absolute: 0.5 x10E3/uL (ref 0.1–0.9)
Monocytes: 8 %
Neutrophils Absolute: 3.4 x10E3/uL (ref 1.4–7.0)
Neutrophils: 53 %
Platelets: 298 x10E3/uL (ref 150–450)
RBC: 4.36 x10E6/uL (ref 3.77–5.28)
RDW: 12 % (ref 11.7–15.4)
WBC: 6.4 x10E3/uL (ref 3.4–10.8)

## 2024-06-12 LAB — COMPREHENSIVE METABOLIC PANEL WITH GFR
ALT: 16 IU/L (ref 0–32)
AST: 16 IU/L (ref 0–40)
Albumin: 4.7 g/dL (ref 4.0–5.0)
Alkaline Phosphatase: 46 IU/L (ref 44–121)
BUN/Creatinine Ratio: 18 (ref 9–23)
BUN: 12 mg/dL (ref 6–20)
Bilirubin Total: 0.4 mg/dL (ref 0.0–1.2)
CO2: 22 mmol/L (ref 20–29)
Calcium: 9.7 mg/dL (ref 8.7–10.2)
Chloride: 105 mmol/L (ref 96–106)
Creatinine, Ser: 0.68 mg/dL (ref 0.57–1.00)
Globulin, Total: 1.8 g/dL (ref 1.5–4.5)
Glucose: 87 mg/dL (ref 70–99)
Potassium: 4.1 mmol/L (ref 3.5–5.2)
Sodium: 141 mmol/L (ref 134–144)
Total Protein: 6.5 g/dL (ref 6.0–8.5)
eGFR: 120 mL/min/1.73 (ref >59)

## 2024-06-12 LAB — TSH RFX ON ABNORMAL TO FREE T4: TSH: 1.52 u[IU]/mL (ref 0.450–4.500)

## 2024-06-13 ENCOUNTER — Encounter (HOSPITAL_BASED_OUTPATIENT_CLINIC_OR_DEPARTMENT_OTHER): Payer: Self-pay | Admitting: Family Medicine

## 2024-06-17 ENCOUNTER — Other Ambulatory Visit

## 2024-06-18 ENCOUNTER — Ambulatory Visit
Admission: RE | Admit: 2024-06-18 | Discharge: 2024-06-18 | Disposition: A | Discharge status: Home / Self Care | Source: Ambulatory Visit | Attending: Family Medicine | Admitting: Family Medicine

## 2024-06-18 DIAGNOSIS — R61 Generalized hyperhidrosis: Secondary | ICD-10-CM

## 2024-06-18 DIAGNOSIS — R109 Unspecified abdominal pain: Secondary | ICD-10-CM

## 2024-06-18 DIAGNOSIS — R198 Other specified symptoms and signs involving the digestive system and abdomen: Secondary | ICD-10-CM

## 2024-07-22 ENCOUNTER — Encounter (HOSPITAL_BASED_OUTPATIENT_CLINIC_OR_DEPARTMENT_OTHER): Payer: 59 | Admitting: Family Medicine

## 2024-07-22 ENCOUNTER — Ambulatory Visit (HOSPITAL_BASED_OUTPATIENT_CLINIC_OR_DEPARTMENT_OTHER): Payer: Self-pay | Admitting: Family Medicine

## 2024-10-23 ENCOUNTER — Encounter (HOSPITAL_BASED_OUTPATIENT_CLINIC_OR_DEPARTMENT_OTHER): Payer: Self-pay | Admitting: Family Medicine

## 2024-10-23 ENCOUNTER — Ambulatory Visit (HOSPITAL_BASED_OUTPATIENT_CLINIC_OR_DEPARTMENT_OTHER): Admitting: Family Medicine

## 2024-10-23 VITALS — BP 115/75 | HR 65 | Temp 98.5°F | Resp 22 | Ht 68.5 in | Wt 163.0 lb

## 2024-10-23 DIAGNOSIS — R198 Other specified symptoms and signs involving the digestive system and abdomen: Secondary | ICD-10-CM | POA: Diagnosis not present

## 2024-10-23 NOTE — Progress Notes (Signed)
" ° ° °  Procedures performed today:    None.  Independent interpretation of notes and tests performed by another provider:   None.  Brief History, Exam, Impression, and Recommendations:    BP 115/75 (BP Location: Left Arm, Patient Position: Sitting, Cuff Size: Normal)   Pulse 65   Temp 98.5 F (36.9 C) (Oral)   Resp (!) 22   Ht 5' 8.5 (1.74 m)   Wt 163 lb (73.9 kg)   LMP  (LMP Unknown)   SpO2 100%   BMI 24.42 kg/m   Change in bowel movement Assessment & Plan: Prior issues have improved since last visit.  Labs and imaging completed previously were reassuring.  Given progress, can proceed with monitoring at this time.  No further testing or evaluation currently.   She does have paperwork today that needs to be completed for work.  This is for general health screening including TB screening.  She denies any current issues with hearing or vision.  No new cardiopulmonary symptoms.  No issues from musculoskeletal standpoint or with lifting/carrying objects.  Cardiovascular exam with regular rate and rhythm, lungs clear to auscultation bilaterally. Form completed today, however patient will need to return for PPD skin test to be placed and read which she will do so next week.  Will finish completing form once this information is obtained.  Return in about 3 months (around 01/21/2025) for CPE with fasting labs 1 week prior.   ___________________________________________ Chardai Gangemi de [place], MD, ABFM, [facility] Primary Care and Sports Medicine [facility] "

## 2024-10-23 NOTE — Assessment & Plan Note (Signed)
 Prior issues have improved since last visit.  Labs and imaging completed previously were reassuring.  Given progress, can proceed with monitoring at this time.  No further testing or evaluation currently.

## 2024-10-27 ENCOUNTER — Ambulatory Visit (INDEPENDENT_AMBULATORY_CARE_PROVIDER_SITE_OTHER): Admitting: Family Medicine

## 2024-10-27 DIAGNOSIS — Z111 Encounter for screening for respiratory tuberculosis: Secondary | ICD-10-CM | POA: Diagnosis not present

## 2024-10-27 NOTE — Progress Notes (Signed)
 PPD Placement note Claire Adams, 34 y.o. female is here today for placement of PPD test Reason for PPD test: Screening for TB Pt taken PPD test before: no Verified in allergy area and with patient that they are not allergic to the products PPD is made of (Phenol or Tween). Yes Is patient taking any oral or IV steroid medication now or have they taken it in the last month? no Has the patient ever received the BCG vaccine?: no Has the patient been in recent contact with anyone known or suspected of having active TB disease?: no      Date of exposure (if applicable): NA      Name of person they were exposed to (if applicable): NA Patient's Country of origin?: [place]  O: Alert and oriented in NAD. P:  PPD placed on 10/27/2024.  Patient advised to return for reading within 48-72 hours.

## 2024-10-29 ENCOUNTER — Encounter (HOSPITAL_BASED_OUTPATIENT_CLINIC_OR_DEPARTMENT_OTHER): Payer: Self-pay

## 2024-10-29 ENCOUNTER — Ambulatory Visit (INDEPENDENT_AMBULATORY_CARE_PROVIDER_SITE_OTHER)

## 2024-10-29 DIAGNOSIS — Z111 Encounter for screening for respiratory tuberculosis: Secondary | ICD-10-CM

## 2024-10-29 LAB — TB SKIN TEST
Induration: 0 mm
TB Skin Test: NEGATIVE

## 2024-10-29 NOTE — Progress Notes (Signed)
"  PPD Reading Note  PPD read and results entered in EpicCare.  Result: 0 mm induration.  Interpretation: Negative  If test not read within 48-72 hours of initial placement, patient advised to repeat in other arm 1-3 weeks after this test.  Allergic reaction: no   "

## 2024-11-06 ENCOUNTER — Ambulatory Visit (HOSPITAL_BASED_OUTPATIENT_CLINIC_OR_DEPARTMENT_OTHER): Admitting: Family Medicine

## 2024-11-11 ENCOUNTER — Ambulatory Visit (HOSPITAL_BASED_OUTPATIENT_CLINIC_OR_DEPARTMENT_OTHER): Admitting: Family Medicine

## 2025-01-26 ENCOUNTER — Ambulatory Visit (HOSPITAL_BASED_OUTPATIENT_CLINIC_OR_DEPARTMENT_OTHER)

## 2025-02-03 ENCOUNTER — Encounter (HOSPITAL_BASED_OUTPATIENT_CLINIC_OR_DEPARTMENT_OTHER): Admitting: Family Medicine
